# Patient Record
Sex: Male | Born: 1985 | Race: Asian | Hispanic: No | Marital: Single | Smoking: Current every day smoker
Health system: Southern US, Community
[De-identification: ages and names within clinical notes are randomized; demographics above are authoritative.]

## PROBLEM LIST (undated history)

## (undated) DIAGNOSIS — K4021 Bilateral inguinal hernia, without obstruction or gangrene, recurrent: Secondary | ICD-10-CM

## (undated) DIAGNOSIS — F32A Depression, unspecified: Secondary | ICD-10-CM

## (undated) DIAGNOSIS — F419 Anxiety disorder, unspecified: Secondary | ICD-10-CM

## (undated) DIAGNOSIS — I10 Essential (primary) hypertension: Secondary | ICD-10-CM

## (undated) DIAGNOSIS — F329 Major depressive disorder, single episode, unspecified: Secondary | ICD-10-CM

## (undated) HISTORY — PX: BRAIN SURGERY: SHX531

---

## 2011-03-01 ENCOUNTER — Emergency Department (HOSPITAL_COMMUNITY)
Admission: EM | Admit: 2011-03-01 | Discharge: 2011-03-01 | Disposition: A | Discharge status: Home / Self Care | Payer: 59 | Attending: Emergency Medicine | Admitting: Emergency Medicine

## 2011-03-01 DIAGNOSIS — F1010 Alcohol abuse, uncomplicated: Secondary | ICD-10-CM | POA: Insufficient documentation

## 2011-03-01 DIAGNOSIS — F1510 Other stimulant abuse, uncomplicated: Secondary | ICD-10-CM | POA: Insufficient documentation

## 2011-03-01 LAB — DIFFERENTIAL
Basophils Absolute: 0.1 10*3/uL (ref 0.0–0.1)
Lymphocytes Relative: 22 % (ref 12–46)
Monocytes Relative: 8 % (ref 3–12)

## 2011-03-01 LAB — CBC
MCV: 75 fL — ABNORMAL LOW (ref 78.0–100.0)
Platelets: 261 10*3/uL (ref 150–400)
RDW: 13.4 % (ref 11.5–15.5)
WBC: 10.2 10*3/uL (ref 4.0–10.5)

## 2011-03-01 LAB — COMPREHENSIVE METABOLIC PANEL
AST: 47 U/L — ABNORMAL HIGH (ref 0–37)
Albumin: 4.3 g/dL (ref 3.5–5.2)
Chloride: 98 mEq/L (ref 96–112)
Creatinine, Ser: 0.82 mg/dL (ref 0.50–1.35)
Potassium: 3.7 mEq/L (ref 3.5–5.1)
Total Bilirubin: 0.9 mg/dL (ref 0.3–1.2)
Total Protein: 8.7 g/dL — ABNORMAL HIGH (ref 6.0–8.3)

## 2011-03-01 LAB — RAPID URINE DRUG SCREEN, HOSP PERFORMED
Amphetamines: POSITIVE — AB
Benzodiazepines: NOT DETECTED
Cocaine: NOT DETECTED
Opiates: NOT DETECTED
Tetrahydrocannabinol: POSITIVE — AB

## 2011-04-12 ENCOUNTER — Inpatient Hospital Stay (INDEPENDENT_AMBULATORY_CARE_PROVIDER_SITE_OTHER)
Admission: RE | Admit: 2011-04-12 | Discharge: 2011-04-12 | Disposition: A | Discharge status: Home / Self Care | Payer: 59 | Source: Ambulatory Visit | Attending: Family Medicine | Admitting: Family Medicine

## 2011-04-12 DIAGNOSIS — R112 Nausea with vomiting, unspecified: Secondary | ICD-10-CM

## 2011-06-30 ENCOUNTER — Emergency Department (HOSPITAL_COMMUNITY): Payer: 59

## 2011-06-30 ENCOUNTER — Inpatient Hospital Stay (HOSPITAL_COMMUNITY)
Admission: EM | Admit: 2011-06-30 | Discharge: 2011-07-02 | DRG: 087 | Disposition: A | Discharge status: Home / Self Care | Payer: 59 | Source: Ambulatory Visit | Attending: Internal Medicine | Admitting: Internal Medicine

## 2011-06-30 ENCOUNTER — Emergency Department (HOSPITAL_COMMUNITY)
Admission: EM | Admit: 2011-06-30 | Discharge: 2011-06-30 | Disposition: A | Discharge status: Home / Self Care | Payer: 59 | Source: Home / Self Care | Attending: Emergency Medicine | Admitting: Emergency Medicine

## 2011-06-30 DIAGNOSIS — F17200 Nicotine dependence, unspecified, uncomplicated: Secondary | ICD-10-CM | POA: Diagnosis present

## 2011-06-30 DIAGNOSIS — F1510 Other stimulant abuse, uncomplicated: Secondary | ICD-10-CM | POA: Diagnosis present

## 2011-06-30 DIAGNOSIS — S066XAA Traumatic subarachnoid hemorrhage with loss of consciousness status unknown, initial encounter: Principal | ICD-10-CM | POA: Diagnosis present

## 2011-06-30 DIAGNOSIS — F1010 Alcohol abuse, uncomplicated: Secondary | ICD-10-CM | POA: Diagnosis present

## 2011-06-30 DIAGNOSIS — Z01812 Encounter for preprocedural laboratory examination: Secondary | ICD-10-CM

## 2011-06-30 DIAGNOSIS — S066X9A Traumatic subarachnoid hemorrhage with loss of consciousness of unspecified duration, initial encounter: Principal | ICD-10-CM | POA: Diagnosis present

## 2011-06-30 LAB — COMPREHENSIVE METABOLIC PANEL
Albumin: 4.3 g/dL (ref 3.5–5.2)
Alkaline Phosphatase: 53 U/L (ref 39–117)
BUN: 14 mg/dL (ref 6–23)
CO2: 27 mEq/L (ref 19–32)
Chloride: 100 mEq/L (ref 96–112)
GFR calc Af Amer: >90 mL/min (ref >90)
GFR calc non Af Amer: >90 mL/min (ref >90)
Glucose, Bld: 114 mg/dL — ABNORMAL HIGH (ref 70–99)
Potassium: 3.4 mEq/L — ABNORMAL LOW (ref 3.5–5.1)
Total Bilirubin: 0.9 mg/dL (ref 0.3–1.2)

## 2011-06-30 LAB — DIFFERENTIAL
Basophils Absolute: 0.1 10*3/uL (ref 0.0–0.1)
Eosinophils Absolute: 0.2 10*3/uL (ref 0.0–0.7)
Lymphocytes Relative: 15 % (ref 12–46)
Lymphs Abs: 1.6 10*3/uL (ref 0.7–4.0)
Monocytes Absolute: 0.5 10*3/uL (ref 0.1–1.0)
Neutro Abs: 8.2 10*3/uL — ABNORMAL HIGH (ref 1.7–7.7)

## 2011-06-30 LAB — RAPID URINE DRUG SCREEN, HOSP PERFORMED: Amphetamines: POSITIVE — AB

## 2011-06-30 LAB — CBC
MCH: 26 pg (ref 26.0–34.0)
MCHC: 35.1 g/dL (ref 30.0–36.0)
MCV: 74 fL — ABNORMAL LOW (ref 78.0–100.0)
RDW: 13.6 % (ref 11.5–15.5)

## 2011-06-30 LAB — PROTIME-INR: Prothrombin Time: 13.9 seconds (ref 11.6–15.2)

## 2011-06-30 LAB — GLUCOSE, CAPILLARY: Glucose-Capillary: 121 mg/dL — ABNORMAL HIGH (ref 70–99)

## 2011-06-30 LAB — ETHANOL: Alcohol, Ethyl (B): <11 mg/dL (ref 0–11)

## 2011-07-01 ENCOUNTER — Inpatient Hospital Stay (HOSPITAL_COMMUNITY): Payer: 59

## 2011-07-01 DIAGNOSIS — F1920 Other psychoactive substance dependence, uncomplicated: Secondary | ICD-10-CM

## 2011-07-01 LAB — COMPREHENSIVE METABOLIC PANEL
BUN: 13 mg/dL (ref 6–23)
Calcium: 9.4 mg/dL (ref 8.4–10.5)
GFR calc Af Amer: >90 mL/min (ref >90)
Glucose, Bld: 110 mg/dL — ABNORMAL HIGH (ref 70–99)
Sodium: 138 mEq/L (ref 135–145)
Total Protein: 7.2 g/dL (ref 6.0–8.3)

## 2011-07-01 LAB — CBC
Hemoglobin: 14 g/dL (ref 13.0–17.0)
MCH: 25.5 pg — ABNORMAL LOW (ref 26.0–34.0)
MCHC: 34.1 g/dL (ref 30.0–36.0)

## 2011-07-01 LAB — MRSA PCR SCREENING: MRSA by PCR: NEGATIVE

## 2011-07-02 ENCOUNTER — Inpatient Hospital Stay (HOSPITAL_COMMUNITY): Payer: 59

## 2011-07-02 LAB — BASIC METABOLIC PANEL
CO2: 26 mEq/L (ref 19–32)
Calcium: 9.2 mg/dL (ref 8.4–10.5)
GFR calc non Af Amer: >90 mL/min (ref >90)
Potassium: 3.4 mEq/L — ABNORMAL LOW (ref 3.5–5.1)
Sodium: 137 mEq/L (ref 135–145)

## 2011-07-02 LAB — CBC
MCH: 26 pg (ref 26.0–34.0)
MCHC: 34 g/dL (ref 30.0–36.0)
Platelets: 215 10*3/uL (ref 150–400)
RBC: 5.5 MIL/uL (ref 4.22–5.81)
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
## 2012-05-22 ENCOUNTER — Encounter (HOSPITAL_COMMUNITY): Payer: Self-pay | Admitting: *Deleted

## 2012-05-22 DIAGNOSIS — F17200 Nicotine dependence, unspecified, uncomplicated: Secondary | ICD-10-CM | POA: Insufficient documentation

## 2012-05-22 DIAGNOSIS — R110 Nausea: Secondary | ICD-10-CM | POA: Insufficient documentation

## 2012-05-22 LAB — URINE MICROSCOPIC-ADD ON

## 2012-05-22 LAB — COMPREHENSIVE METABOLIC PANEL
ALT: 23 U/L (ref 0–53)
AST: 27 U/L (ref 0–37)
Alkaline Phosphatase: 62 U/L (ref 39–117)
CO2: 28 mEq/L (ref 19–32)
Calcium: 10.3 mg/dL (ref 8.4–10.5)
Chloride: 100 mEq/L (ref 96–112)
GFR calc non Af Amer: 86 mL/min — ABNORMAL LOW (ref >90)
Potassium: 3.7 mEq/L (ref 3.5–5.1)
Sodium: 140 mEq/L (ref 135–145)

## 2012-05-22 LAB — URINALYSIS, ROUTINE W REFLEX MICROSCOPIC
Leukocytes, UA: NEGATIVE
Protein, ur: 30 mg/dL — AB
Specific Gravity, Urine: 1.035 — ABNORMAL HIGH (ref 1.005–1.030)
Urobilinogen, UA: 1 mg/dL (ref 0.0–1.0)

## 2012-05-22 LAB — CBC WITH DIFFERENTIAL/PLATELET
Basophils Absolute: 0.1 10*3/uL (ref 0.0–0.1)
Eosinophils Relative: 2 % (ref 0–5)
Lymphocytes Relative: 18 % (ref 12–46)
Neutro Abs: 7.2 10*3/uL (ref 1.7–7.7)
Neutrophils Relative %: 69 % (ref 43–77)
Platelets: 262 10*3/uL (ref 150–400)
RBC: 5.81 MIL/uL (ref 4.22–5.81)
RDW: 13.6 % (ref 11.5–15.5)
WBC: 10.4 10*3/uL (ref 4.0–10.5)

## 2012-05-22 NOTE — ED Notes (Signed)
Advised of the wait 

## 2012-05-22 NOTE — ED Notes (Signed)
The pt thinks he may have food poisioning he has abd pain nv and diarrhea with a sorethroat since last pm

## 2012-05-22 NOTE — ED Notes (Signed)
He reports he feels better but he needs a note for work

## 2012-05-23 ENCOUNTER — Emergency Department (HOSPITAL_COMMUNITY)
Admission: EM | Admit: 2012-05-23 | Discharge: 2012-05-23 | Disposition: A | Discharge status: Home / Self Care | Payer: Self-pay | Attending: Emergency Medicine | Admitting: Emergency Medicine

## 2012-05-23 DIAGNOSIS — R110 Nausea: Secondary | ICD-10-CM

## 2012-05-23 DIAGNOSIS — F1910 Other psychoactive substance abuse, uncomplicated: Secondary | ICD-10-CM

## 2012-05-23 NOTE — ED Notes (Signed)
Pt presents with c/o frequent relapsing  with meth last used on 05/14/12 and c/o  ? Food poisoning after eating unknown type of fish--unsure it was even fish since he stated "I was already messed up".  Does not know time of fish consumption. Has been nauseated, not vomiting "I just swallow it down". And feels like diarrheabfut "I just hold it in".  States generalized pain  Rated 7/10.  Has taken no pain meds.Marland Kitchen  LBM 3 days ago.

## 2012-05-23 NOTE — ED Provider Notes (Signed)
Medical screening examination/treatment/procedure(s) were performed by non-physician practitioner and as supervising physician I was immediately available for consultation/collaboration.  Jaidy Cottam, MD 05/23/12 0548 

## 2012-05-23 NOTE — ED Provider Notes (Signed)
History     CSN: [id number]  Arrival date & time 05/22/12  2027   First MD Initiated Contact with Patient 05/23/12 0208      Chief Complaint  Patient presents with  . poss food poisoning     (Consider location/radiation/quality/duration/timing/severity/associated sxs/prior treatment) HPI Comments: Patient states last night.  He drank a lot of alcohol use methamphetamines, which he uses intermittently.  Had several episodes of nausea, no vomiting, and one episode of loose stool.  He has had no repeat of these symptoms.  The last 12 hours.  He presents tonight stating he wants a pill to help him stop the craving for alcohol and, methadone.  He's been to [facility] at [facility] , for detox, approximately 3 months ago.  Is not suicidal is not homicidal.  Is requesting a note for work as he missed his shift last night  The history is provided by the patient.    History reviewed. No pertinent past medical history.  History reviewed. No pertinent past surgical history.  No family history on file.  History  Substance Use Topics  . Smoking status: Current Everyday Smoker  . Smokeless tobacco: Not on file  . Alcohol Use: Yes      Review of Systems  Constitutional: Negative for fever and chills.  Gastrointestinal: Positive for nausea. Negative for vomiting and abdominal pain.  Neurological: Negative for dizziness and headaches.    Allergies  Review of patient's allergies indicates no known allergies.  Home Medications  No current outpatient prescriptions on file.  BP 137/105  Pulse 105  Temp 98.7 F (37.1 C) (Oral)  Resp 18  SpO2 100%  Physical Exam  Constitutional: He appears well-developed and well-nourished.  HENT:  Head: Normocephalic.  Eyes: Pupils are equal, round, and reactive to light.  Neck: Normal range of motion.  Cardiovascular: Normal rate.   Abdominal: Soft. He exhibits no distension. There is no tenderness.  Musculoskeletal: Normal range of motion.    Neurological: He is alert.  Skin: Skin is warm.  Psychiatric: He has a normal mood and affect.    ED Course  Procedures (including critical care time)  Labs Reviewed  URINALYSIS, ROUTINE W REFLEX MICROSCOPIC - Abnormal; Notable for the following:    Color, Urine AMBER (*)  BIOCHEMICALS MAY BE AFFECTED BY COLOR   Specific Gravity, Urine 1.035 (*)     Bilirubin Urine SMALL (*)     Ketones, ur 40 (*)     Protein, ur 30 (*)     All other components within normal limits  CBC WITH DIFFERENTIAL - Abnormal; Notable for the following:    MCV 75.6 (*)     Monocytes Absolute 1.1 (*)     All other components within normal limits  COMPREHENSIVE METABOLIC PANEL - Abnormal; Notable for the following:    Total Protein 8.7 (*)     GFR calc non Af Amer 86 (*)     All other components within normal limits  RAPID STREP SCREEN  URINE MICROSCOPIC-ADD ON   No results found.   No diagnosis found.    MDM  Patient.  Is not suicidal, homicidal.  Is requesting resources in the community to help with his addiction problem, which include, alcohol, methamphetamines, and gambling he also is requesting a work note         Arman Filter, NP 05/23/12 0234

## 2013-01-11 ENCOUNTER — Emergency Department (HOSPITAL_COMMUNITY)
Admission: EM | Admit: 2013-01-11 | Discharge: 2013-01-11 | Disposition: A | Discharge status: Home / Self Care | Payer: Self-pay | Attending: Emergency Medicine | Admitting: Emergency Medicine

## 2013-01-11 ENCOUNTER — Emergency Department (HOSPITAL_COMMUNITY): Payer: Self-pay

## 2013-01-11 ENCOUNTER — Encounter (HOSPITAL_COMMUNITY): Payer: Self-pay | Admitting: Emergency Medicine

## 2013-01-11 DIAGNOSIS — K4020 Bilateral inguinal hernia, without obstruction or gangrene, not specified as recurrent: Secondary | ICD-10-CM | POA: Insufficient documentation

## 2013-01-11 DIAGNOSIS — N5089 Other specified disorders of the male genital organs: Secondary | ICD-10-CM | POA: Insufficient documentation

## 2013-01-11 DIAGNOSIS — F17200 Nicotine dependence, unspecified, uncomplicated: Secondary | ICD-10-CM | POA: Insufficient documentation

## 2013-01-11 DIAGNOSIS — N509 Disorder of male genital organs, unspecified: Secondary | ICD-10-CM | POA: Insufficient documentation

## 2013-01-11 LAB — CBC WITH DIFFERENTIAL/PLATELET
Basophils Absolute: 0.2 10*3/uL — ABNORMAL HIGH (ref 0.0–0.1)
Eosinophils Absolute: 0.5 10*3/uL (ref 0.0–0.7)
Hemoglobin: 14.4 g/dL (ref 13.0–17.0)
Lymphs Abs: 1.6 10*3/uL (ref 0.7–4.0)
MCH: 25.3 pg — ABNORMAL LOW (ref 26.0–34.0)
MCHC: 33.9 g/dL (ref 30.0–36.0)
Monocytes Absolute: 0.8 10*3/uL (ref 0.1–1.0)
Monocytes Relative: 5 % (ref 3–12)
Neutro Abs: 12.4 10*3/uL — ABNORMAL HIGH (ref 1.7–7.7)
Platelets: 236 10*3/uL (ref 150–400)
RBC: 5.69 MIL/uL (ref 4.22–5.81)
RDW: 14.1 % (ref 11.5–15.5)

## 2013-01-11 LAB — COMPREHENSIVE METABOLIC PANEL
AST: 25 U/L (ref 0–37)
Albumin: 3.8 g/dL (ref 3.5–5.2)
BUN: 16 mg/dL (ref 6–23)
Calcium: 9.2 mg/dL (ref 8.4–10.5)
Creatinine, Ser: 0.97 mg/dL (ref 0.50–1.35)
Total Bilirubin: 0.2 mg/dL — ABNORMAL LOW (ref 0.3–1.2)
Total Protein: 7.4 g/dL (ref 6.0–8.3)

## 2013-01-11 LAB — URINALYSIS, ROUTINE W REFLEX MICROSCOPIC
Bilirubin Urine: NEGATIVE
Leukocytes, UA: NEGATIVE
Nitrite: NEGATIVE
Specific Gravity, Urine: 1.013 (ref 1.005–1.030)
pH: 6 (ref 5.0–8.0)

## 2013-01-11 MED ORDER — HYDROCODONE-ACETAMINOPHEN 5-325 MG PO TABS: 2.0000 | ORAL_TABLET | ORAL | Status: DC | PRN

## 2013-01-11 MED ORDER — IOHEXOL 300 MG/ML  SOLN
100.0000 mL | Freq: Once | INTRAMUSCULAR | Status: AC | PRN
  Administered 2013-01-11: 100 mL via INTRAVENOUS

## 2013-01-11 MED ORDER — ONDANSETRON HCL 4 MG PO TABS: 4.0000 mg | ORAL_TABLET | Freq: Four times a day (QID) | ORAL | Status: DC

## 2013-01-11 MED ORDER — IOHEXOL 300 MG/ML  SOLN
50.0000 mL | Freq: Once | INTRAMUSCULAR | Status: AC | PRN
  Administered 2013-01-11: 50 mL via INTRAVENOUS

## 2013-01-11 MED ORDER — IBUPROFEN 800 MG PO TABS
800.0000 mg | ORAL_TABLET | Freq: Once | ORAL | Status: AC
  Administered 2013-01-11: 800 mg via ORAL
  Filled 2013-01-11: qty 1

## 2013-01-11 NOTE — ED Notes (Signed)
Patient transported to CT 

## 2013-01-11 NOTE — ED Provider Notes (Signed)
History     CSN: [id number]  Arrival date & time 01/11/13  2011   First MD Initiated Contact with Patient 01/11/13 2016      Chief Complaint  Patient presents with  . Abdominal Pain    (Consider location/radiation/quality/duration/timing/severity/associated sxs/prior treatment) HPI Comments: Patient complains of right groin pain and testicles swelling. It is difficult historian he states this is been going on for a number of months. That he may have had an injury when he is lifting at work. He complains of pain in his right inguinal area that radiates to his right testicle. He states his scrotum has been swollen for several months. He was told at a physical he had a "ruptured testicle sac". Denies any dysuria, hematuria, abdominal pain, nausea or vomiting. No change in bowel habits. No fever or chills.  The history is provided by the patient.    History reviewed. No pertinent past medical history.  History reviewed. No pertinent past surgical history.  No family history on file.  History  Substance Use Topics  . Smoking status: Current Every Day Smoker  . Smokeless tobacco: Not on file  . Alcohol Use: Yes      Review of Systems  Constitutional: Negative for fever, activity change and appetite change.  HENT: Negative for congestion, rhinorrhea and neck pain.   Eyes: Negative for visual disturbance.  Respiratory: Negative for cough, chest tightness and shortness of breath.   Cardiovascular: Negative for chest pain.  Gastrointestinal: Positive for abdominal pain. Negative for nausea, vomiting and diarrhea.  Genitourinary: Positive for scrotal swelling and testicular pain. Negative for dysuria and hematuria.  Musculoskeletal: Negative for back pain and arthralgias.  Neurological: Negative for dizziness, weakness and headaches.  A complete 10 system review of systems was obtained and all systems are negative except as noted in the HPI and PMH.    Allergies  Review of  patient's allergies indicates no known allergies.  Home Medications   Current Outpatient Rx  Name  Route  Sig  Dispense  Refill  . acetaminophen (TYLENOL) 325 MG tablet   Oral   Take 650 mg by mouth every 6 (six) hours as needed for pain.         Marland Kitchen HYDROcodone-acetaminophen (NORCO/VICODIN) 5-325 MG per tablet   Oral   Take 2 tablets by mouth every 4 (four) hours as needed for pain.   10 tablet   0   . ondansetron (ZOFRAN) 4 MG tablet   Oral   Take 1 tablet (4 mg total) by mouth every 6 (six) hours.   12 tablet   0     BP 130/79  Pulse 83  Temp(Src) 97.7 F (36.5 C) (Oral)  Resp 18  SpO2 98%  Physical Exam  Constitutional: He is oriented to person, place, and time. He appears well-developed and well-nourished. No distress.  HENT:  Head: Normocephalic and atraumatic.  Mouth/Throat: Oropharynx is clear and moist. No oropharyngeal exudate.  Eyes: Conjunctivae and EOM are normal. Pupils are equal, round, and reactive to light.  Neck: Normal range of motion. Neck supple.  Cardiovascular: Normal rate, regular rhythm and normal heart sounds.   No murmur heard. Pulmonary/Chest: Effort normal and breath sounds normal. No respiratory distress.  Abdominal: Soft. There is tenderness. There is no rebound and no guarding.  TTP R inguinal area without obvious hernia.  Genitourinary:  Diffuse scrotal swelling.  Testicles nontender to palpation.  Musculoskeletal: Normal range of motion. He exhibits no edema and no tenderness.  Neurological:  He is alert and oriented to person, place, and time. No cranial nerve deficit. He exhibits normal muscle tone. Coordination normal.  Skin: Skin is warm.    ED Course  Procedures (including critical care time)  Labs Reviewed  CBC WITH DIFFERENTIAL - Abnormal; Notable for the following:    WBC 15.5 (*)    MCV 74.7 (*)    MCH 25.3 (*)    Neutrophils Relative 81 (*)    Lymphocytes Relative 10 (*)    Neutro Abs 12.4 (*)    Basophils  Absolute 0.2 (*)    All other components within normal limits  COMPREHENSIVE METABOLIC PANEL - Abnormal; Notable for the following:    Glucose, Bld 102 (*)    Total Bilirubin 0.2 (*)    All other components within normal limits  URINALYSIS, ROUTINE W REFLEX MICROSCOPIC   US Scrotum  01/11/2013  *RADIOLOGY REPORT*  Clinical Data:  Right testicular pain  SCROTAL ULTRASOUND DOPPLER ULTRASOUND OF THE TESTICLES  Technique: Complete ultrasound examination of the testicles, epididymis, and other scrotal structures was performed.  Color and spectral Doppler ultrasound were also utilized to evaluate blood flow to the testicles.  Comparison:  None.  Findings:  Right testis:  Normal in size and appearance, measuring 4.0 x 2.0 x 2.3 cm.  Left testis:  Normal in size appearance, measuring 4.3 x 2.1 x 3.0 cm.  Right epididymis:  Normal in size and appearance.  Left epididymis:  Normal in size and appearance.  Hydrocele:  Absent.  Varicocele:  Small left varicocele.  Pulsed Doppler interrogation of both testes demonstrates low resistance flow bilaterally.  During Valsalva, no bowel-containing hernia is seen in the right inguinal canal.  IMPRESSION: Normal sonographic appearance of the bilateral testes.  No evidence of testicular torsion.  Small left varicocele.   Original Report Authenticated By: Charline Bills, M.D.    Ct Abdomen Pelvis W Contrast  01/11/2013  *RADIOLOGY REPORT*  Clinical Data: Right-sided abdominal pain described as a burning and standing.  The patient reports an area of swelling in this location.  Some nausea without vomiting.  CT ABDOMEN AND PELVIS WITH CONTRAST  Technique:  Multidetector CT imaging of the abdomen and pelvis was performed following the standard protocol during bolus administration of intravenous contrast.  Contrast: OMNIPAQUE IOHEXOL 300 MG/ML  SOLN, 50mL OMNIPAQUE IOHEXOL 300 MG/ML  SOLN  Comparison: None.  Findings: There are bilateral fat containing inguinal hernias.  A  portion of the right transverse colon, which descends into the pelvis, lies adjacent to the right internal inguinal canal but does not enter the hernia.  Clear lung bases.  The heart is normal in size.  Normal liver, spleen, gallbladder and pancreas.  No bile duct dilation.  No adrenal masses.  There is mild dilation of the right renal pelvis and portions of the right ureter.  No right ureteral stone is seen.  This may be physiologic.  There is no CT evidence of pyelonephritis.  There are no renal masses.  The left renal collecting system and ureter are unremarkable.  The bladder is mostly decompressed.  No bladder mass or stone is seen.  No adenopathy.  No abnormal fluid collections.  Bowel is unremarkable.  Normal appendix.  No bony abnormality.  IMPRESSION: Mild dilation of the right renal pelvis of unclear etiology.  It is most likely physiologic.  No renal or ureteral stone is seen. However, depending on the symptoms, a recently passed ureteral stone should be considered.  Bilateral fat containing  inguinal hernias.  No bowel enters either of these.  No other abnormalities.  A normal appendix is visualized.   Original Report Authenticated By: Amie Portland, M.D.    [place] Art/ven Flow Abd Pelv Doppler  01/11/2013  *RADIOLOGY REPORT*  Clinical Data:  Right testicular pain  SCROTAL ULTRASOUND DOPPLER ULTRASOUND OF THE TESTICLES  Technique: Complete ultrasound examination of the testicles, epididymis, and other scrotal structures was performed.  Color and spectral Doppler ultrasound were also utilized to evaluate blood flow to the testicles.  Comparison:  None.  Findings:  Right testis:  Normal in size and appearance, measuring 4.0 x 2.0 x 2.3 cm.  Left testis:  Normal in size appearance, measuring 4.3 x 2.1 x 3.0 cm.  Right epididymis:  Normal in size and appearance.  Left epididymis:  Normal in size and appearance.  Hydrocele:  Absent.  Varicocele:  Small left varicocele.  Pulsed Doppler interrogation of both testes  demonstrates low resistance flow bilaterally.  During Valsalva, no bowel-containing hernia is seen in the right inguinal canal.  IMPRESSION: Normal sonographic appearance of the bilateral testes.  No evidence of testicular torsion.  Small left varicocele.   Original Report Authenticated By: Charline Bills, M.D.      1. Inguinal hernia, bilateral       MDM  Right-sided groin pain that has been going on for several weeks but worse tonight. Associated right testicle pain. No fevers, abdominal pain, nausea or vomiting.  Abdomen is soft.  Urinalysis is negative. Testicles appear normal on ultrasound. No evidence of torsion.  CT shows fat containing bilateral inguinal hernias.  No bowel contents.  Patient referred to surgery for elective repair.  Pain control given.          Glynn Octave, MD 01/11/13 [phone]

## 2013-01-11 NOTE — ED Notes (Signed)
Patient with lower right sided abdominal pain that he describes as burning and stabbing.  Patient reports there is an area of swelling there as well.  Patient reports he has had some nausea but no vomiting.  The pain makes the patient shiver he reports.  No injury to the area reported.  Patient reports a history of a "ruptured testicle sac."  He was told this during an employment physical.

## 2015-07-12 ENCOUNTER — Emergency Department (HOSPITAL_COMMUNITY): Payer: Self-pay

## 2015-07-12 ENCOUNTER — Emergency Department (HOSPITAL_COMMUNITY): Payer: Self-pay | Admitting: Anesthesiology

## 2015-07-12 ENCOUNTER — Inpatient Hospital Stay (HOSPITAL_COMMUNITY)
Admission: EM | Admit: 2015-07-12 | Discharge: 2015-07-14 | DRG: 352 | Disposition: A | Discharge status: Home / Self Care | Payer: Self-pay | Attending: General Surgery | Admitting: General Surgery

## 2015-07-12 ENCOUNTER — Encounter (HOSPITAL_COMMUNITY): Admission: EM | Disposition: A | Discharge status: Home / Self Care | Payer: Self-pay | Source: Home / Self Care

## 2015-07-12 ENCOUNTER — Encounter (HOSPITAL_COMMUNITY): Payer: Self-pay | Admitting: Oncology

## 2015-07-12 DIAGNOSIS — F329 Major depressive disorder, single episode, unspecified: Secondary | ICD-10-CM | POA: Diagnosis present

## 2015-07-12 DIAGNOSIS — E876 Hypokalemia: Secondary | ICD-10-CM | POA: Diagnosis present

## 2015-07-12 DIAGNOSIS — I10 Essential (primary) hypertension: Secondary | ICD-10-CM | POA: Diagnosis present

## 2015-07-12 DIAGNOSIS — F1290 Cannabis use, unspecified, uncomplicated: Secondary | ICD-10-CM | POA: Diagnosis present

## 2015-07-12 DIAGNOSIS — Z791 Long term (current) use of non-steroidal anti-inflammatories (NSAID): Secondary | ICD-10-CM

## 2015-07-12 DIAGNOSIS — F419 Anxiety disorder, unspecified: Secondary | ICD-10-CM | POA: Diagnosis present

## 2015-07-12 DIAGNOSIS — N50819 Testicular pain, unspecified: Secondary | ICD-10-CM | POA: Diagnosis present

## 2015-07-12 DIAGNOSIS — N5089 Other specified disorders of the male genital organs: Secondary | ICD-10-CM | POA: Diagnosis present

## 2015-07-12 DIAGNOSIS — K4030 Unilateral inguinal hernia, with obstruction, without gangrene, not specified as recurrent: Principal | ICD-10-CM | POA: Diagnosis present

## 2015-07-12 DIAGNOSIS — F17200 Nicotine dependence, unspecified, uncomplicated: Secondary | ICD-10-CM | POA: Diagnosis present

## 2015-07-12 HISTORY — DX: Bilateral inguinal hernia, without obstruction or gangrene, recurrent: K40.21

## 2015-07-12 HISTORY — DX: Anxiety disorder, unspecified: F41.9

## 2015-07-12 HISTORY — DX: Essential (primary) hypertension: I10

## 2015-07-12 HISTORY — PX: LAPAROTOMY: SHX154

## 2015-07-12 HISTORY — DX: Major depressive disorder, single episode, unspecified: F32.9

## 2015-07-12 HISTORY — DX: Depression, unspecified: F32.A

## 2015-07-12 HISTORY — PX: INGUINAL HERNIA REPAIR: SHX194

## 2015-07-12 LAB — CBC WITH DIFFERENTIAL/PLATELET
BASOS ABS: 0.1 10*3/uL (ref 0.0–0.1)
BASOS PCT: 1 %
EOS ABS: 0.2 10*3/uL (ref 0.0–0.7)
EOS PCT: 1 %
HCT: 44.6 % (ref 39.0–52.0)
HEMOGLOBIN: 15.6 g/dL (ref 13.0–17.0)
LYMPHS ABS: 1.9 10*3/uL (ref 0.7–4.0)
Lymphocytes Relative: 13 %
MCH: 26.4 pg (ref 26.0–34.0)
MCHC: 35 g/dL (ref 30.0–36.0)
MCV: 75.6 fL — ABNORMAL LOW (ref 78.0–100.0)
Monocytes Absolute: 1 10*3/uL (ref 0.1–1.0)
Monocytes Relative: 7 %
NEUTROS PCT: 78 %
Neutro Abs: 11.4 10*3/uL — ABNORMAL HIGH (ref 1.7–7.7)
PLATELETS: 266 10*3/uL (ref 150–400)
RBC: 5.9 MIL/uL — AB (ref 4.22–5.81)
RDW: 13.3 % (ref 11.5–15.5)
WBC: 14.6 10*3/uL — ABNORMAL HIGH (ref 4.0–10.5)

## 2015-07-12 LAB — BASIC METABOLIC PANEL
ANION GAP: 10 (ref 5–15)
BUN: 15 mg/dL (ref 6–20)
CHLORIDE: 100 mmol/L — AB (ref 101–111)
CO2: 25 mmol/L (ref 22–32)
Calcium: 9.5 mg/dL (ref 8.9–10.3)
Creatinine, Ser: 0.95 mg/dL (ref 0.61–1.24)
Glucose, Bld: 91 mg/dL (ref 65–99)
POTASSIUM: 3.3 mmol/L — AB (ref 3.5–5.1)
SODIUM: 135 mmol/L (ref 135–145)

## 2015-07-12 SURGERY — LAPAROTOMY, EXPLORATORY
Anesthesia: General | Site: Abdomen | Laterality: Right

## 2015-07-12 SURGERY — LAPAROTOMY, EXPLORATORY
Anesthesia: General | Laterality: Right

## 2015-07-12 MED ORDER — HYDROMORPHONE HCL 1 MG/ML IJ SOLN: 0.2500 mg | INTRAMUSCULAR | Status: DC | PRN

## 2015-07-12 MED ORDER — CEFAZOLIN SODIUM-DEXTROSE 2-3 GM-% IV SOLR: 2.0000 g | Freq: Three times a day (TID) | INTRAVENOUS | Status: DC

## 2015-07-12 MED ORDER — HYDROMORPHONE HCL 1 MG/ML IJ SOLN
1.0000 mg | INTRAMUSCULAR | Status: DC | PRN
  Administered 2015-07-12 (×2): 1 mg via INTRAVENOUS
  Filled 2015-07-12 (×2): qty 1

## 2015-07-12 MED ORDER — DEXTROSE IN LACTATED RINGERS 5 % IV SOLN
INTRAVENOUS | Status: DC
Start: 2015-07-12 — End: 2015-07-12

## 2015-07-12 MED ORDER — ENOXAPARIN SODIUM 40 MG/0.4ML ~~LOC~~ SOLN
40.0000 mg | SUBCUTANEOUS | Status: DC
  Administered 2015-07-13: 40 mg via SUBCUTANEOUS
  Filled 2015-07-12 (×3): qty 0.4

## 2015-07-12 MED ORDER — SUCCINYLCHOLINE CHLORIDE 20 MG/ML IJ SOLN
INTRAMUSCULAR | Status: DC | PRN
  Administered 2015-07-12: 100 mg via INTRAVENOUS
  Administered 2015-07-12: 60 mg via INTRAVENOUS

## 2015-07-12 MED ORDER — BUPIVACAINE-EPINEPHRINE (PF) 0.25% -1:200000 IJ SOLN
INTRAMUSCULAR | Status: AC
  Filled 2015-07-12: qty 30

## 2015-07-12 MED ORDER — METHOCARBAMOL 500 MG PO TABS: 500.0000 mg | ORAL_TABLET | Freq: Four times a day (QID) | ORAL | Status: DC | PRN

## 2015-07-12 MED ORDER — ONDANSETRON HCL 4 MG/2ML IJ SOLN
INTRAMUSCULAR | Status: DC | PRN
  Administered 2015-07-12: 4 mg via INTRAVENOUS

## 2015-07-12 MED ORDER — CEFAZOLIN SODIUM-DEXTROSE 2-3 GM-% IV SOLR
2.0000 g | INTRAVENOUS | Status: AC
  Administered 2015-07-12: 2 g via INTRAVENOUS

## 2015-07-12 MED ORDER — FENTANYL CITRATE (PF) 100 MCG/2ML IJ SOLN
INTRAMUSCULAR | Status: DC | PRN
  Administered 2015-07-12: 100 ug via INTRAVENOUS
  Administered 2015-07-12: 50 ug via INTRAVENOUS
  Administered 2015-07-12: 100 ug via INTRAVENOUS

## 2015-07-12 MED ORDER — NEOSTIGMINE METHYLSULFATE 10 MG/10ML IV SOLN
INTRAVENOUS | Status: DC | PRN
Start: 2015-07-12 — End: 2015-07-12
  Administered 2015-07-12: 4 mg via INTRAVENOUS

## 2015-07-12 MED ORDER — INFLUENZA VAC SPLIT QUAD 0.5 ML IM SUSY
0.5000 mL | PREFILLED_SYRINGE | INTRAMUSCULAR | Status: DC
  Filled 2015-07-12 (×2): qty 0.5

## 2015-07-12 MED ORDER — MIDAZOLAM HCL 5 MG/5ML IJ SOLN
INTRAMUSCULAR | Status: DC | PRN
  Administered 2015-07-12: 2 mg via INTRAVENOUS

## 2015-07-12 MED ORDER — HYDROMORPHONE HCL 1 MG/ML IJ SOLN
1.0000 mg | Freq: Once | INTRAMUSCULAR | Status: AC
  Administered 2015-07-12: 1 mg via INTRAVENOUS
  Filled 2015-07-12: qty 1

## 2015-07-12 MED ORDER — DEXAMETHASONE SODIUM PHOSPHATE 10 MG/ML IJ SOLN
INTRAMUSCULAR | Status: AC
  Filled 2015-07-12: qty 1

## 2015-07-12 MED ORDER — OXYCODONE HCL 5 MG PO TABS
5.0000 mg | ORAL_TABLET | ORAL | Status: DC | PRN
  Administered 2015-07-12: 10 mg via ORAL
  Administered 2015-07-12: 5 mg via ORAL
  Administered 2015-07-12 – 2015-07-14 (×9): 10 mg via ORAL
  Filled 2015-07-12: qty 2
  Filled 2015-07-12: qty 1
  Filled 2015-07-12 (×9): qty 2

## 2015-07-12 MED ORDER — CEFAZOLIN SODIUM-DEXTROSE 2-3 GM-% IV SOLR
INTRAVENOUS | Status: AC
  Filled 2015-07-12: qty 50

## 2015-07-12 MED ORDER — PROPOFOL 10 MG/ML IV BOLUS
INTRAVENOUS | Status: DC | PRN
  Administered 2015-07-12: 200 mg via INTRAVENOUS

## 2015-07-12 MED ORDER — ACETAMINOPHEN 325 MG PO TABS: 650.0000 mg | ORAL_TABLET | Freq: Four times a day (QID) | ORAL | Status: DC | PRN

## 2015-07-12 MED ORDER — SIMETHICONE 80 MG PO CHEW
40.0000 mg | CHEWABLE_TABLET | Freq: Four times a day (QID) | ORAL | Status: DC | PRN
  Filled 2015-07-12: qty 1

## 2015-07-12 MED ORDER — LIDOCAINE HCL (CARDIAC) 20 MG/ML IV SOLN
INTRAVENOUS | Status: AC
  Filled 2015-07-12: qty 5

## 2015-07-12 MED ORDER — ACETAMINOPHEN 650 MG RE SUPP: 650.0000 mg | Freq: Four times a day (QID) | RECTAL | Status: DC | PRN

## 2015-07-12 MED ORDER — ROCURONIUM BROMIDE 100 MG/10ML IV SOLN
INTRAVENOUS | Status: AC
  Filled 2015-07-12: qty 1

## 2015-07-12 MED ORDER — PROPOFOL 10 MG/ML IV BOLUS
INTRAVENOUS | Status: AC
  Filled 2015-07-12: qty 20

## 2015-07-12 MED ORDER — LACTATED RINGERS IV BOLUS (SEPSIS)
1000.0000 mL | Freq: Once | INTRAVENOUS | Status: DC
Start: 2015-07-12 — End: 2015-07-13

## 2015-07-12 MED ORDER — MIDAZOLAM HCL 2 MG/2ML IJ SOLN
INTRAMUSCULAR | Status: AC
  Filled 2015-07-12: qty 4

## 2015-07-12 MED ORDER — CEFAZOLIN SODIUM-DEXTROSE 2-3 GM-% IV SOLR
2.0000 g | Freq: Three times a day (TID) | INTRAVENOUS | Status: AC
  Administered 2015-07-12 – 2015-07-13 (×3): 2 g via INTRAVENOUS
  Filled 2015-07-12 (×3): qty 50

## 2015-07-12 MED ORDER — GLYCOPYRROLATE 0.2 MG/ML IJ SOLN
INTRAMUSCULAR | Status: DC | PRN
  Administered 2015-07-12: 0.6 mg via INTRAVENOUS

## 2015-07-12 MED ORDER — BUPIVACAINE-EPINEPHRINE 0.25% -1:200000 IJ SOLN
INTRAMUSCULAR | Status: DC | PRN
  Administered 2015-07-12: 10 mL

## 2015-07-12 MED ORDER — KCL IN DEXTROSE-NACL 20-5-0.45 MEQ/L-%-% IV SOLN
INTRAVENOUS | Status: DC
  Administered 2015-07-12: 12:00:00 via INTRAVENOUS
  Filled 2015-07-12 (×5): qty 1000

## 2015-07-12 MED ORDER — IOHEXOL 300 MG/ML  SOLN
100.0000 mL | Freq: Once | INTRAMUSCULAR | Status: AC | PRN
  Administered 2015-07-12: 100 mL via INTRAVENOUS

## 2015-07-12 MED ORDER — DOCUSATE SODIUM 100 MG PO CAPS
100.0000 mg | ORAL_CAPSULE | Freq: Two times a day (BID) | ORAL | Status: DC
  Administered 2015-07-12 – 2015-07-13 (×3): 100 mg via ORAL
  Filled 2015-07-12 (×6): qty 1

## 2015-07-12 MED ORDER — ONDANSETRON HCL 4 MG/2ML IJ SOLN
INTRAMUSCULAR | Status: AC
  Filled 2015-07-12: qty 2

## 2015-07-12 MED ORDER — ONDANSETRON HCL 4 MG/2ML IJ SOLN
4.0000 mg | Freq: Once | INTRAMUSCULAR | Status: AC
  Administered 2015-07-12: 4 mg via INTRAVENOUS
  Filled 2015-07-12: qty 2

## 2015-07-12 MED ORDER — GLYCOPYRROLATE 0.2 MG/ML IJ SOLN
INTRAMUSCULAR | Status: AC
  Filled 2015-07-12: qty 3

## 2015-07-12 MED ORDER — ROCURONIUM BROMIDE 100 MG/10ML IV SOLN
INTRAVENOUS | Status: DC | PRN
  Administered 2015-07-12: 10 mg via INTRAVENOUS
  Administered 2015-07-12: 25 mg via INTRAVENOUS

## 2015-07-12 MED ORDER — LACTATED RINGERS IV SOLN
INTRAVENOUS | Status: DC | PRN
  Administered 2015-07-12 (×2): via INTRAVENOUS

## 2015-07-12 MED ORDER — 0.9 % SODIUM CHLORIDE (POUR BTL) OPTIME
TOPICAL | Status: DC | PRN
  Administered 2015-07-12: 1000 mL

## 2015-07-12 MED ORDER — LIDOCAINE HCL (CARDIAC) 20 MG/ML IV SOLN
INTRAVENOUS | Status: DC | PRN
  Administered 2015-07-12: 75 mg via INTRAVENOUS

## 2015-07-12 MED ORDER — POTASSIUM CHLORIDE 10 MEQ/100ML IV SOLN: 10.0000 meq | INTRAVENOUS | Status: AC

## 2015-07-12 MED ORDER — ONDANSETRON 4 MG PO TBDP: 4.0000 mg | ORAL_TABLET | Freq: Four times a day (QID) | ORAL | Status: DC | PRN

## 2015-07-12 MED ORDER — IOHEXOL 300 MG/ML  SOLN
50.0000 mL | Freq: Once | INTRAMUSCULAR | Status: AC | PRN
  Administered 2015-07-12: 50 mL via ORAL

## 2015-07-12 MED ORDER — FENTANYL CITRATE (PF) 250 MCG/5ML IJ SOLN
INTRAMUSCULAR | Status: AC
  Filled 2015-07-12: qty 25

## 2015-07-12 MED ORDER — BISACODYL 5 MG PO TBEC: 5.0000 mg | DELAYED_RELEASE_TABLET | Freq: Every day | ORAL | Status: DC | PRN

## 2015-07-12 MED ORDER — ACETAMINOPHEN 10 MG/ML IV SOLN
INTRAVENOUS | Status: AC
  Filled 2015-07-12: qty 100

## 2015-07-12 MED ORDER — NEOSTIGMINE METHYLSULFATE 10 MG/10ML IV SOLN
INTRAVENOUS | Status: AC
  Filled 2015-07-12: qty 1

## 2015-07-12 MED ORDER — PROMETHAZINE HCL 25 MG/ML IJ SOLN: 6.2500 mg | INTRAMUSCULAR | Status: DC | PRN

## 2015-07-12 MED ORDER — DEXAMETHASONE SODIUM PHOSPHATE 10 MG/ML IJ SOLN
INTRAMUSCULAR | Status: DC | PRN
  Administered 2015-07-12: 10 mg via INTRAVENOUS

## 2015-07-12 MED ORDER — ONDANSETRON HCL 4 MG/2ML IJ SOLN: 4.0000 mg | Freq: Four times a day (QID) | INTRAMUSCULAR | Status: DC | PRN

## 2015-07-12 SURGICAL SUPPLY — 62 items
APPLICATOR COTTON TIP 6IN STRL (MISCELLANEOUS) ×6 IMPLANT
BENZOIN TINCTURE PRP APPL 2/3 (GAUZE/BANDAGES/DRESSINGS) IMPLANT
BLADE EXTENDED COATED 6.5IN (ELECTRODE) IMPLANT
BLADE HEX COATED 2.75 (ELECTRODE) ×3 IMPLANT
CHLORAPREP W/TINT 26ML (MISCELLANEOUS) ×3 IMPLANT
CLOSURE WOUND 1/2 X4 (GAUZE/BANDAGES/DRESSINGS)
COVER MAYO STAND STRL (DRAPES) IMPLANT
COVER SURGICAL LIGHT HANDLE (MISCELLANEOUS) ×3 IMPLANT
DECANTER SPIKE VIAL GLASS SM (MISCELLANEOUS) ×3 IMPLANT
DERMABOND ADVANCED (GAUZE/BANDAGES/DRESSINGS) ×2
DERMABOND ADVANCED .7 DNX12 (GAUZE/BANDAGES/DRESSINGS) ×1 IMPLANT
DRAIN PENROSE 18X1/2 LTX STRL (DRAIN) IMPLANT
DRAPE LAPAROSCOPIC ABDOMINAL (DRAPES) ×3 IMPLANT
DRAPE WARM FLUID 44X44 (DRAPE) IMPLANT
ELECT REM PT RETURN 9FT ADLT (ELECTROSURGICAL) ×3
ELECTRODE REM PT RTRN 9FT ADLT (ELECTROSURGICAL) ×1 IMPLANT
GAUZE SPONGE 4X4 12PLY STRL (GAUZE/BANDAGES/DRESSINGS) ×3 IMPLANT
GAUZE SPONGE 4X4 16PLY XRAY LF (GAUZE/BANDAGES/DRESSINGS) ×3 IMPLANT
GLOVE BIO SURGEON STRL SZ7 (GLOVE) ×3 IMPLANT
GLOVE BIOGEL PI IND STRL 7.0 (GLOVE) IMPLANT
GLOVE BIOGEL PI IND STRL 7.5 (GLOVE) ×1 IMPLANT
GLOVE BIOGEL PI INDICATOR 7.0 (GLOVE)
GLOVE BIOGEL PI INDICATOR 7.5 (GLOVE) ×2
GOWN STRL REUS W/ TWL XL LVL3 (GOWN DISPOSABLE) ×1 IMPLANT
GOWN STRL REUS W/TWL LRG LVL3 (GOWN DISPOSABLE) ×6 IMPLANT
GOWN STRL REUS W/TWL XL LVL3 (GOWN DISPOSABLE) ×5 IMPLANT
KIT BASIN OR (CUSTOM PROCEDURE TRAY) ×3 IMPLANT
LIGASURE IMPACT 36 18CM CVD LR (INSTRUMENTS) IMPLANT
LIQUID BAND (GAUZE/BANDAGES/DRESSINGS) IMPLANT
MESH ULTRAPRO 3X6 7.6X15CM (Mesh General) ×3 IMPLANT
NEEDLE HYPO 25X1 1.5 SAFETY (NEEDLE) ×3 IMPLANT
NS IRRIG 1000ML POUR BTL (IV SOLUTION) ×6 IMPLANT
PACK GENERAL/GYN (CUSTOM PROCEDURE TRAY) ×3 IMPLANT
SHEARS FOC LG CVD HARMONIC 17C (MISCELLANEOUS) IMPLANT
SHEARS HARMONIC ACE PLUS 36CM (ENDOMECHANICALS) IMPLANT
SPONGE LAP 18X18 X RAY DECT (DISPOSABLE) IMPLANT
STAPLER VISISTAT 35W (STAPLE) ×3 IMPLANT
STRIP CLOSURE SKIN 1/2X4 (GAUZE/BANDAGES/DRESSINGS) IMPLANT
SUCTION POOLE TIP (SUCTIONS) IMPLANT
SUT MNCRL AB 4-0 PS2 18 (SUTURE) ×3 IMPLANT
SUT PDS AB 1 CTX 36 (SUTURE) IMPLANT
SUT PDS AB 1 TP1 96 (SUTURE) IMPLANT
SUT PROLENE 2 0 SH DA (SUTURE) ×6 IMPLANT
SUT SILK 2 0 (SUTURE) ×2
SUT SILK 2 0 SH (SUTURE) IMPLANT
SUT SILK 2 0 SH CR/8 (SUTURE) IMPLANT
SUT SILK 2-0 18XBRD TIE 12 (SUTURE) ×1 IMPLANT
SUT SILK 3 0 (SUTURE)
SUT SILK 3 0 SH CR/8 (SUTURE) IMPLANT
SUT SILK 3-0 18XBRD TIE 12 (SUTURE) IMPLANT
SUT VIC AB 2-0 SH 18 (SUTURE) ×3 IMPLANT
SUT VIC AB 2-0 SH 27 (SUTURE) ×4
SUT VIC AB 2-0 SH 27X BRD (SUTURE) ×2 IMPLANT
SUT VIC AB 3-0 54XBRD REEL (SUTURE) ×1 IMPLANT
SUT VIC AB 3-0 BRD 54 (SUTURE) ×2
SUT VIC AB 3-0 SH 27 (SUTURE) ×2
SUT VIC AB 3-0 SH 27XBRD (SUTURE) ×1 IMPLANT
SYR CONTROL 10ML LL (SYRINGE) ×3 IMPLANT
TOWEL OR 17X26 10 PK STRL BLUE (TOWEL DISPOSABLE) ×3 IMPLANT
TRAY FOLEY W/METER SILVER 14FR (SET/KITS/TRAYS/PACK) IMPLANT
TRAY FOLEY W/METER SILVER 16FR (SET/KITS/TRAYS/PACK) IMPLANT
YANKAUER SUCT BULB TIP NO VENT (SUCTIONS) IMPLANT

## 2015-07-12 NOTE — ED Provider Notes (Signed)
CSN: [id number]     Arrival date & time 07/12/15  0226 History   First MD Initiated Contact with Patient 07/12/15 0231     Chief Complaint  Patient presents with  . Inguinal Hernia     (Consider location/radiation/quality/duration/timing/severity/associated sxs/prior Treatment) HPI Comments: Patient presents with severe right inguinal and testicular pain with swelling that started earlier this evening. He has a known hernia diagnosed 2 years ago. He reports intermittent pain since diagnosis that "I usually walk off". Tonight the pain started about 8 hours ago and has caused nausea and vomiting. No fever. He reports his usual inguinal swelling is worse.   The history is provided by the patient. No language interpreter was used.    Past Medical History  Diagnosis Date  . Inguinal hernia recurrent bilateral   . Hypertension   . Anxiety   . Depression    Past Surgical History  Procedure Laterality Date  . Brain surgery  2013?    brain bleed   No family history on file. Social History  Substance Use Topics  . Smoking status: Current Every Day Smoker  . Smokeless tobacco: Never Used  . Alcohol Use: Yes    Review of Systems  Constitutional: Negative for fever and chills.  Gastrointestinal: Positive for vomiting and abdominal pain.  Genitourinary: Positive for scrotal swelling.       See HPI.  Neurological: Negative.       Allergies  Review of patient's allergies indicates no known allergies.  Home Medications   Prior to Admission medications   Medication Sig Start Date End Date Taking? Authorizing Provider  ibuprofen (ADVIL,MOTRIN) 200 MG tablet Take 400 mg by mouth every 6 (six) hours as needed (for pain.).   Yes Historical Provider, MD   BP 123/95 mmHg  Pulse 88  Temp(Src) 98.2 F (36.8 C) (Oral)  Resp 20  Ht  (1.753 m)  Wt 180 lb (81.647 kg)  BMI 26.57 kg/m2  SpO2 99% Physical Exam  Constitutional: He is oriented to person, place, and time. He appears  well-developed and well-nourished.  Neck: Normal range of motion.  Pulmonary/Chest: Effort normal.  Genitourinary:  Marked right testicular swelling with tenderness. There is a large swollen mass at the right base of the penis that is immobile, significantly hard and exquisitely tender.   Musculoskeletal: Normal range of motion.  Neurological: He is alert and oriented to person, place, and time.  Skin: Skin is warm and dry.  Psychiatric: He has a normal mood and affect.    ED Course  Procedures (including critical care time) Labs Review Labs Reviewed  CBC WITH DIFFERENTIAL/PLATELET - Abnormal; Notable for the following:    WBC 14.6 (*)    RBC 5.90 (*)    MCV 75.6 (*)    Neutro Abs 11.4 (*)    All other components within normal limits  BASIC METABOLIC PANEL   Results for orders placed or performed during the hospital encounter of 07/12/15  CBC with Differential  Result Value Ref Range   WBC 14.6 (H) 4.0 - 10.5 K/uL   RBC 5.90 (H) 4.22 - 5.81 MIL/uL   Hemoglobin 15.6 13.0 - 17.0 g/dL   HCT 40.9 81.1 - 91.4 %   MCV 75.6 (L) 78.0 - 100.0 fL   MCH 26.4 26.0 - 34.0 pg   MCHC 35.0 30.0 - 36.0 g/dL   RDW 78.2 95.6 - 21.3 %   Platelets 266 150 - 400 K/uL   Neutrophils Relative % 78 %  Neutro Abs 11.4 (H) 1.7 - 7.7 K/uL   Lymphocytes Relative 13 %   Lymphs Abs 1.9 0.7 - 4.0 K/uL   Monocytes Relative 7 %   Monocytes Absolute 1.0 0.1 - 1.0 K/uL   Eosinophils Relative 1 %   Eosinophils Absolute 0.2 0.0 - 0.7 K/uL   Basophils Relative 1 %   Basophils Absolute 0.1 0.0 - 0.1 K/uL  Basic metabolic panel  Result Value Ref Range   Sodium 135 135 - 145 mmol/L   Potassium 3.3 (L) 3.5 - 5.1 mmol/L   Chloride 100 (L) 101 - 111 mmol/L   CO2 25 22 - 32 mmol/L   Glucose, Bld 91 65 - 99 mg/dL   BUN 15 6 - 20 mg/dL   Creatinine, Ser 1.610.95 0.61 - 1.24 mg/dL   Calcium 9.5 8.9 - 09.610.3 mg/dL   GFR calc non Af Amer >60 >60 mL/min   GFR calc Af Amer >60 >60 mL/min   Anion gap 10 5 - 15   Ct  Abdomen Pelvis W Contrast  07/12/2015  CLINICAL DATA:  Worsening right inguinal hernia, with acute onset of pain. Leukocytosis. Initial encounter. EXAM: CT ABDOMEN AND PELVIS WITH CONTRAST TECHNIQUE: Multidetector CT imaging of the abdomen and pelvis was performed using the standard protocol following bolus administration of intravenous contrast. CONTRAST:  100mL OMNIPAQUE IOHEXOL 300 MG/ML  SOLN COMPARISON:  CT of the abdomen and pelvis performed 01/11/2013. FINDINGS: The visualized lung bases are clear. A nonspecific 1.2 cm hypodensity is noted within the right hepatic lobe. The liver and spleen are otherwise unremarkable. The gallbladder is within normal limits. The pancreas and adrenal glands are unremarkable. The kidneys are unremarkable in appearance. There is no evidence of hydronephrosis. No renal or ureteral stones are seen. No perinephric stranding is appreciated. The small bowel is unremarkable in appearance. The stomach is within normal limits. No acute vascular abnormalities are seen. There is herniation of the mildly redundant hepatic flexure of the colon and proximal ascending colon into a large right inguinal hernia, extending into the scrotum, with associated distention of the herniated segment and surrounding soft tissue inflammation and fluid. The more distal transverse colon is partially decompressed. Findings are compatible with partial obstruction secondary to the herniation. The appendix is unremarkable in appearance, and contains air. The remaining colon is unremarkable in appearance. The bladder is moderately distended and grossly unremarkable. The prostate remains normal in size. No inguinal lymphadenopathy is seen. Mild soft tissue inflammation is noted within the scrotum, more prominent on the right. No acute osseous abnormalities are identified. 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 IMPRESSION: 1. Herniation of the mildly redundant hepatic  flexure of the colon and proximal ascending colon into a large right inguinal hernia, extending into the scrotum, with associated distention of the herniated segment and surrounding soft tissue inflammation and fluid. Partial decompression of the more distal transverse colon. Findings compatible with partial obstruction secondary to the herniation. 2. Mild surrounding soft tissue inflammation noted within the scrotum, more prominent on the right. 3. Nonspecific small 1.2 cm hypodensity within the right hepatic lobe, new from the prior study. Would correlate with LFTs and consider further evaluation as deemed clinically appropriate. 4. 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 These results were called by telephone at the time of interpretation on 07/12/2015 at 4:43 am to [facility] Domique Reardon PA, who verbally acknowledged these results. Electronically Signed   By: Roanna RaiderJeffery  Chang M.D.   On:  07/12/2015 04:44    Imaging Review No results found. I have personally reviewed and evaluated these images and lab results as part of my medical decision-making.   EKG Interpretation None      MDM   Final diagnoses:  None    1. Incarcerated inguinal hernia  Pain is moderately managed. No vomiting, fever. Patient's CT shows incarcerated hernia with partial obstruction of bowel. Findings discussed with Dr. Donell Beers who will see the patient in the ED.    Elpidio Anis, PA-C 07/12/15 0517  Courteney Randall An, MD 07/12/15 [phone]

## 2015-07-12 NOTE — Transfer of Care (Signed)
Immediate Anesthesia Transfer of Care Note  Patient: Andrew Vang  Procedure(s) Performed: Procedure(s): EXPLORATORY LAPAROTOMY (Right) HERNIA REPAIR INGUINAL ADULT (Right)  Patient Location: PACU  Anesthesia Type:General  Level of Consciousness: awake, alert , oriented and patient cooperative  Airway & Oxygen Therapy: Patient Spontanous Breathing and Patient connected to face mask oxygen  Post-op Assessment: Report given to RN, Post -op Vital signs reviewed and stable and Patient moving all extremities X 4  Post vital signs: stable  Last Vitals:  Filed Vitals:   07/12/15 0700  BP: 124/79  Pulse:   Temp:   Resp:     Complications: No apparent anesthesia complications

## 2015-07-12 NOTE — Anesthesia Procedure Notes (Signed)
Procedure Name: Intubation Date/Time: 07/12/2015 8:23 AM Performed by: Illene SilverEVANS, Charelle Petrakis E Pre-anesthesia Checklist: Patient identified, Emergency Drugs available, Suction available and Patient being monitored Patient Re-evaluated:Patient Re-evaluated prior to inductionOxygen Delivery Method: Circle System Utilized Preoxygenation: Pre-oxygenation with 100% oxygen Intubation Type: IV induction Ventilation: Mask ventilation without difficulty Laryngoscope Size: Mac and 4 Grade View: Grade I Tube type: Oral Tube size: 7.5 mm Number of attempts: 1 Airway Equipment and Method: Stylet and Oral airway (RSI with cricoid) Placement Confirmation: ETT inserted through vocal cords under direct vision,  positive ETCO2 and breath sounds checked- equal and bilateral Secured at: 21 cm Tube secured with: Tape Dental Injury: Teeth and Oropharynx as per pre-operative assessment

## 2015-07-12 NOTE — Anesthesia Preprocedure Evaluation (Addendum)
Anesthesia Evaluation  Patient identified by MRN, date of birth, ID band Patient awake    Reviewed: Allergy & Precautions, NPO status   Airway Mallampati: I  TM Distance: >3 FB Neck ROM: Full    Dental  (+) Teeth Intact   Pulmonary Current Smoker,    breath sounds clear to auscultation       Cardiovascular hypertension,  Rhythm:Regular Rate:Normal     Neuro/Psych    GI/Hepatic Hernia incarcerated   Endo/Other  negative endocrine ROS  Renal/GU      Musculoskeletal   Abdominal   Peds  Hematology negative hematology ROS (+)   Anesthesia Other Findings   Reproductive/Obstetrics                            Anesthesia Physical Anesthesia Plan  ASA: II and emergent  Anesthesia Plan: General   Post-op Pain Management:    Induction: Intravenous  Airway Management Planned: Oral ETT  Additional Equipment:   Intra-op Plan:   Post-operative Plan: Extubation in OR  Informed Consent: I have reviewed the patients History and Physical, chart, labs and discussed the procedure including the risks, benefits and alternatives for the proposed anesthesia with the patient or authorized representative who has indicated his/her understanding and acceptance.   Dental advisory given  Plan Discussed with: CRNA and Surgeon  Anesthesia Plan Comments:         Anesthesia Quick Evaluation

## 2015-07-12 NOTE — Op Note (Signed)
Preoperative diagnosis: Incarcerated right inguinal hernia with bowel obstruction Postoperative diagnosis: Same as above Procedure: Right inguinal hernia repair with ultra Pro mesh Surgeon: Dr. Harden MoMatt Annarose Ouellet Anesthesia: Gen. Estimated blood loss: Minimal Locations: None Drains: None Specimens: None Sponge and needle count was correct   Indications: This is a healthy [age] year old male that hasa two-year history of a right groin hernia that has become progressively more symptomatic. He comes in now with an incarcerated right groin hernia with colon in his scrotum and signs and symptoms of bowel obstruction. We discussed proceeding to the operating room.  Procedure: After informed consent was obtained the patient was taken to the operating room. He was given antibiotics. Sequential compression devices were on his legs. He was then placed under general anesthesia without complication. His right groin, scrotum, and abdomen were prepped and draped in the standard sterile surgical fashion after a Foley catheter was introduced. A surgical timeout was then performed.  I injected Marcaine in the right groin as well as performed a tap block. I then made a right groin incision. I was able to identify as inferior epigastric vein and I ligated this with 2-0 silk suture. I then entered the external oblique through the external ring. I encircled his cord as well as the bowel that was incarcerated. I was able to enter into the sac and identified his colon as well as omentum which was incarcerated in his scrotum. There were some thrombosed veins on his omentum as well as some patchy ischemic areas around the fat around his colon. There was some small dusky areas of his colon. Once I released these these looked better. I elected not to resect any of this. I was able to reduce all of this back into his abdomen after carefully inspecting it. Once I had done this I then removed the sac and close this. I then was able to  identify all of this cord structures. I then closed the internal ring with 2-0 Vicryl suture. I then due to the fact that there was not really any contamination and just some fluid present elected to place a piece of UltraPro mesh. I made a patch and laid this on the floor. I sutured this into position to the pubic tubercle several times with a 2-0 Vicryl suture. I made a T cut and wrapped this around the spermatic cord. I secured this every half centimeter to the inguinal ligament below. I laid the lateral portion flat. I sewed the cut ends of the mesh together as well as to the internal oblique. There was no evidence of any additional hernia and this completely completely covered the defect. I then obtained hemostasis. I then closed the external oblique with 2-0 Vicryl. The Scarpa's fascia was closed with 3-0 Vicryl. The skin was closed 4-0 Monocryl. I had evacuated all of the contents of the scrotum which were a fair amount of blood clots present. I then placed globally the incision. He tolerated this well was extubated and transferred to recovery stable.

## 2015-07-12 NOTE — ED Notes (Signed)
Per EMS pt presents d/t a right sided inguinal hernia that he has had x 2 years however has not been able to have this issue taken care of d/t lack of insurance.  Pt reported to EMS that he was trying to sleep but the pain became severe prompting his to call EMS.  Pt is ambulatory from ambulance to room.

## 2015-07-12 NOTE — ED Notes (Signed)
Bed: [id number] Expected date:  Expected time:  Means of arrival:  Comments: 54M hernia

## 2015-07-12 NOTE — Transfer of Care (Signed)
Immediate Anesthesia Transfer of Care Note  Patient: Andrew Vang  Procedure(s) Performed: Procedure(s): EXPLORATORY LAPAROTOMY (Right) HERNIA REPAIR INGUINAL ADULT (Right)  Patient Location: PACU  Anesthesia Type:General  Level of Consciousness: awake, alert , oriented and patient cooperative  Airway & Oxygen Therapy: Patient Spontanous Breathing and Patient connected to face mask oxygen  Post-op Assessment: Report given to RN, Post -op Vital signs reviewed and stable and Patient moving all extremities X 4  Post vital signs: stable  Last Vitals:  Filed Vitals:   07/12/15 1051  BP: 145/93  Pulse: 75  Temp: 36.9 C  Resp: 20    Complications: No apparent anesthesia complications

## 2015-07-12 NOTE — ED Notes (Signed)
Patient transported to CT 

## 2015-07-12 NOTE — Anesthesia Postprocedure Evaluation (Signed)
  Anesthesia Post-op Note  Patient: Andrew Vang  Procedure(s) Performed: Procedure(s): EXPLORATORY LAPAROTOMY (Right) HERNIA REPAIR INGUINAL ADULT (Right)  Patient Location: PACU  Anesthesia Type:General  Level of Consciousness: awake and sedated  Airway and Oxygen Therapy: Patient Spontanous Breathing  Post-op Pain: mild  Post-op Assessment: Post-op Vital signs reviewed              Post-op Vital Signs: stable  Last Vitals:  Filed Vitals:   07/12/15 1030  BP:   Pulse:   Temp: 36.4 C  Resp:     Complications: No apparent anesthesia complications

## 2015-07-12 NOTE — H&P (Signed)
Andrew Vang is an 29 y.o. male.   Chief Complaint: right inguinal hernia HPI:  Pt is a 34 yo M with a 2 year history of right inguinal hernia.  It has been getting incarcerated over the last few months, but normally he has "been able to walk it off" or lay down and rest until it gets better.  Today the pain was even more excruciating than normal and the hernia has stayed down and will not go away.  He also developed nausea and vomiting this time.  He does not have fever or chills.  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    Past Medical History  Diagnosis Date  . Inguinal hernia recurrent bilateral   . Hypertension   . Anxiety   . Depression     Past Surgical History  Procedure Laterality Date  . Brain surgery  2013?    brain bleed    No family history on file. Social History:  reports that he has been smoking.  He has never used smokeless tobacco. He reports that he drinks alcohol. He reports that he uses illicit drugs (Marijuana). Works as a Biomedical scientist.    Allergies: No Known Allergies  Meds:  Ibuprofen.    Results for orders placed or performed during the hospital encounter of 07/12/15 (from the past 48 hour(s))  CBC with Differential     Status: Abnormal   Collection Time: 07/12/15  3:02 AM  Result Value Ref Range   WBC 14.6 (H) 4.0 - 10.5 K/uL   RBC 5.90 (H) 4.22 - 5.81 MIL/uL   Hemoglobin 15.6 13.0 - 17.0 g/dL   HCT 44.6 39.0 - 52.0 %   MCV 75.6 (L) 78.0 - 100.0 fL   MCH 26.4 26.0 - 34.0 pg   MCHC 35.0 30.0 - 36.0 g/dL   RDW 13.3 11.5 - 15.5 %   Platelets 266 150 - 400 K/uL   Neutrophils Relative % 78 %   Neutro Abs 11.4 (H) 1.7 - 7.7 K/uL   Lymphocytes Relative 13 %   Lymphs Abs 1.9 0.7 - 4.0 K/uL   Monocytes Relative 7 %   Monocytes Absolute 1.0 0.1 - 1.0 K/uL   Eosinophils Relative 1 %   Eosinophils Absolute 0.2 0.0 - 0.7 K/uL   Basophils Relative 1 %   Basophils Absolute 0.1 0.0  - 0.1 K/uL  Basic metabolic panel     Status: Abnormal   Collection Time: 07/12/15  3:02 AM  Result Value Ref Range   Sodium 135 135 - 145 mmol/L   Potassium 3.3 (L) 3.5 - 5.1 mmol/L   Chloride 100 (L) 101 - 111 mmol/L   CO2 25 22 - 32 mmol/L   Glucose, Bld 91 65 - 99 mg/dL   BUN 15 6 - 20 mg/dL   Creatinine, Ser 0.95 0.61 - 1.24 mg/dL   Calcium 9.5 8.9 - 10.3 mg/dL   GFR calc non Af Amer >60 >60 mL/min   GFR calc Af Amer >60 >60 mL/min    Comment: (NOTE) The eGFR has been calculated using the CKD EPI equation. This calculation has not been validated in all clinical situations. eGFR's persistently <60 mL/min signify possible Chronic Kidney Disease.    Anion gap 10 5 - 15   Ct Abdomen Pelvis W Contrast  07/12/2015  CLINICAL DATA:  Worsening right inguinal hernia, with acute onset of pain.  Leukocytosis. Initial encounter. EXAM: CT ABDOMEN AND PELVIS WITH CONTRAST TECHNIQUE: Multidetector CT imaging of the abdomen and pelvis was performed using the standard protocol following bolus administration of intravenous contrast. CONTRAST:  140m OMNIPAQUE IOHEXOL 300 MG/ML  SOLN COMPARISON:  CT of the abdomen and pelvis performed 01/11/2013. FINDINGS: The visualized lung bases are clear. A nonspecific 1.2 cm hypodensity is noted within the right hepatic lobe. The liver and spleen are otherwise unremarkable. The gallbladder is within normal limits. The pancreas and adrenal glands are unremarkable. The kidneys are unremarkable in appearance. There is no evidence of hydronephrosis. No renal or ureteral stones are seen. No perinephric stranding is appreciated. The small bowel is unremarkable in appearance. The stomach is within normal limits. No acute vascular abnormalities are seen. There is herniation of the mildly redundant hepatic flexure of the colon and proximal ascending colon into a large right inguinal hernia, extending into the scrotum, with associated distention of the herniated segment and  surrounding soft tissue inflammation and fluid. The more distal transverse colon is partially decompressed. Findings are compatible with partial obstruction secondary to the herniation. The appendix is unremarkable in appearance, and contains air. The remaining colon is unremarkable in appearance. The bladder is moderately distended and grossly unremarkable. The prostate remains normal in size. No inguinal lymphadenopathy is seen. Mild soft tissue inflammation is noted within the scrotum, more prominent on the right. No acute osseous abnormalities are identified. 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 IMPRESSION: 1. Herniation of the mildly redundant hepatic flexure of the colon and proximal ascending colon into a large right inguinal hernia, extending into the scrotum, with associated distention of the herniated segment and surrounding soft tissue inflammation and fluid. Partial decompression of the more distal transverse colon. Findings compatible with partial obstruction secondary to the herniation. 2. Mild surrounding soft tissue inflammation noted within the scrotum, more prominent on the right. 3. Nonspecific small 1.2 cm hypodensity within the right hepatic lobe, new from the prior study. Would correlate with LFTs and consider further evaluation as deemed clinically appropriate. 4. 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 These results were called by telephone at the time of interpretation on 07/12/2015 at 4:43 am to SWeeki WacheePA, who verbally acknowledged these results. Electronically Signed   By: JGarald BaldingM.D.   On: 07/12/2015 04:44    Review of Systems  Constitutional: Negative.   HENT: Negative.   Eyes: Negative.   Respiratory: Negative.   Cardiovascular: Negative.   Gastrointestinal: Positive for nausea, vomiting and abdominal pain. Negative for diarrhea, constipation and blood in stool.  Genitourinary:       Groin  pain  Musculoskeletal: Negative.   Skin: Negative.   Neurological: Negative.   Endo/Heme/Allergies: Negative.   Psychiatric/Behavioral: Negative.     Blood pressure 128/91, pulse 86, temperature 98.2 F (36.8 C), temperature source Oral, resp. rate 20, height 5' 9"  (1.753 m), weight 81.647 kg (180 lb), SpO2 100 %. Physical Exam  Constitutional: He is oriented to person, place, and time. He appears well-developed and well-nourished. He appears distressed.  HENT:  Head: Normocephalic and atraumatic.  Eyes: Conjunctivae are normal. No scleral icterus.  Neck: Neck supple. No thyromegaly present.  Cardiovascular: Normal rate and intact distal pulses.   Respiratory: Effort normal. No respiratory distress.  GI: Soft. He exhibits distension (mildly distended). There is tenderness (at groin.  no abdominal tenderness). There is no rebound and no guarding. A hernia is present. Hernia  confirmed positive in the right inguinal area (incarcerated.  tender.  no overlying skin changes.).  Musculoskeletal: Normal range of motion.  Neurological: He is alert and oriented to person, place, and time.  Skin: Skin is warm and dry. No rash noted. He is not diaphoretic. No erythema. No pallor.  Psychiatric: He has a normal mood and affect. His behavior is normal. Judgment and thought content normal.     Assessment/Plan Incarcerated strangulated right inguinal hernia.  Pt will need operative repair.  Discussed with patient that he will likely need exploratory laparotomy in addition to groin incision because he appears to have bowel that may be compromised.  He may need bowel resection.   Would not plan mesh placement due to possible bowel resection.   Reviewed risks of surgery briefly as well as post op restrictions.  Discussed that recovery and time in hospital depend on how extensive surgery is.   He needs IV fluid resuscitation and potassium repletion for hypokalemia.    Annalisia Ingber 07/12/2015, 6:25  AM

## 2015-07-13 LAB — CBC
HCT: 40.4 % (ref 39.0–52.0)
Hemoglobin: 13.4 g/dL (ref 13.0–17.0)
MCH: 25.4 pg — AB (ref 26.0–34.0)
MCHC: 33.2 g/dL (ref 30.0–36.0)
MCV: 76.7 fL — AB (ref 78.0–100.0)
PLATELETS: 238 10*3/uL (ref 150–400)
RBC: 5.27 MIL/uL (ref 4.22–5.81)
RDW: 13.5 % (ref 11.5–15.5)
WBC: 14 10*3/uL — ABNORMAL HIGH (ref 4.0–10.5)

## 2015-07-13 LAB — BASIC METABOLIC PANEL
Anion gap: 6 (ref 5–15)
BUN: 7 mg/dL (ref 6–20)
CHLORIDE: 101 mmol/L (ref 101–111)
CO2: 30 mmol/L (ref 22–32)
CREATININE: 1.05 mg/dL (ref 0.61–1.24)
Calcium: 8.8 mg/dL — ABNORMAL LOW (ref 8.9–10.3)
GFR calc Af Amer: >60 mL/min (ref >60)
GFR calc non Af Amer: >60 mL/min (ref >60)
GLUCOSE: 115 mg/dL — AB (ref 65–99)
Potassium: 4 mmol/L (ref 3.5–5.1)
Sodium: 137 mmol/L (ref 135–145)

## 2015-07-13 NOTE — Progress Notes (Signed)
1 Day Post-Op  Subjective: Some flatus, pain controlled, no complaints, discussed procedure  Objective: Vital signs in last 24 hours: Temp:  [97.5 F (36.4 C)-98.4 F (36.9 C)] 98.1 F (36.7 C) (10/30 0500) Pulse Rate:  [63-75] 65 (10/30 0500) Resp:  [14-20] 16 (10/30 0500) BP: (106-151)/(66-95) 108/66 mmHg (10/30 0500) SpO2:  [98 %-100 %] 98 % (10/30 0500) Last BM Date: 07/10/15  Intake/Output from previous day: 10/29 0701 - 10/30 0700 In: 1820 [P.O.:120; I.V.:1700] Out: 3580 [Urine:3430; Blood:150] Intake/Output this shift:    General appearance: no distress Resp: clear to auscultation bilaterally Cardio: regular rate and rhythm GI: soft nt/nd bs present Male genitalia: normal, minimal scrotal edema today  Lab Results:   Recent Labs  07/12/15 0302 07/13/15 0600  WBC 14.6* 14.0*  HGB 15.6 13.4  HCT 44.6 40.4  PLT 266 238   BMET  Recent Labs  07/12/15 0302 07/13/15 0600  NA 135 137  K 3.3* 4.0  CL 100* 101  CO2 25 30  GLUCOSE 91 115*  BUN 15 7  CREATININE 0.95 1.05  CALCIUM 9.5 8.8*   PT/INR No results for input(s): LABPROT, INR in the last 72 hours. ABG No results for input(s): PHART, HCO3 in the last 72 hours.  Invalid input(s): PCO2, PO2  Studies/Results: Ct Abdomen Pelvis W Contrast  07/12/2015  CLINICAL DATA:  Worsening right inguinal hernia, with acute onset of pain. Leukocytosis. Initial encounter. EXAM: CT ABDOMEN AND PELVIS WITH CONTRAST TECHNIQUE: Multidetector CT imaging of the abdomen and pelvis was performed using the standard protocol following bolus administration of intravenous contrast. CONTRAST:  OMNIPAQUE IOHEXOL 300 MG/ML  SOLN COMPARISON:  CT of the abdomen and pelvis performed 01/11/2013. FINDINGS: The visualized lung bases are clear. A nonspecific 1.2 cm hypodensity is noted within the right hepatic lobe. The liver and spleen are otherwise unremarkable. The gallbladder is within normal limits. The pancreas and adrenal  glands are unremarkable. The kidneys are unremarkable in appearance. There is no evidence of hydronephrosis. No renal or ureteral stones are seen. No perinephric stranding is appreciated. The small bowel is unremarkable in appearance. The stomach is within normal limits. No acute vascular abnormalities are seen. There is herniation of the mildly redundant hepatic flexure of the colon and proximal ascending colon into a large right inguinal hernia, extending into the scrotum, with associated distention of the herniated segment and surrounding soft tissue inflammation and fluid. The more distal transverse colon is partially decompressed. Findings are compatible with partial obstruction secondary to the herniation. The appendix is unremarkable in appearance, and contains air. The remaining colon is unremarkable in appearance. The bladder is moderately distended and grossly unremarkable. The prostate remains normal in size. No inguinal lymphadenopathy is seen. Mild soft tissue inflammation is noted within the scrotum, more prominent on the right. No acute osseous abnormalities are identified. 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 IMPRESSION: 1. Herniation of the mildly redundant hepatic flexure of the colon and proximal ascending colon into a large right inguinal hernia, extending into the scrotum, with associated distention of the herniated segment and surrounding soft tissue inflammation and fluid. Partial decompression of the more distal transverse colon. Findings compatible with partial obstruction secondary to the herniation. 2. Mild surrounding soft tissue inflammation noted within the scrotum, more prominent on the right. 3. Nonspecific small 1.2 cm hypodensity within the right hepatic lobe, new from the prior study. Would correlate with LFTs and consider further evaluation as deemed clinically appropriate.  4. 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 These results were called by telephone at the time of interpretation on 07/12/2015 at 4:43 am to [facility]I UPSTILL PA, who verbally acknowledged these results. Electronically Signed   By: Roanna RaiderJeffery  Chang M.D.   On: 07/12/2015 04:44    Anti-infectives: Anti-infectives    Start     Dose/Rate Route Frequency Ordered Stop   07/12/15 1400  ceFAZolin (ANCEF) IVPB 2 g/50 mL premix  Status:  Discontinued     2 g 100 mL/hr over 30 Minutes Intravenous 3 times per day 07/12/15 1001 07/12/15 1012   07/12/15 1400  ceFAZolin (ANCEF) IVPB 2 g/50 mL premix     2 g 100 mL/hr over 30 Minutes Intravenous 3 times per day 07/12/15 1050 07/13/15 0633   07/12/15 1400  ceFAZolin (ANCEF) IVPB 2 g/50 mL premix  Status:  Discontinued     2 g 100 mL/hr over 30 Minutes Intravenous 3 times per day 07/12/15 1012 07/12/15 1043   07/12/15 0645  ceFAZolin (ANCEF) IVPB 2 g/50 mL premix     2 g 100 mL/hr over 30 Minutes Intravenous On call to O.R. 07/12/15 0636 07/12/15 0804      Assessment/Plan: POD 1 rih repair for incarcerated hernia  1. Po pain meds with iv backup 2. pulm toilet, oob, ics 3. Will advance diet as tolerated, his colon had evidence of ischemia that improved once released but this will need to be monitored 4. Dc foley 5. lovenox scds 6. Will recheck cbc in am hopefully wbc going down, possibly home in am [facility],Emileigh Kellett 07/13/2015

## 2015-07-14 ENCOUNTER — Encounter (HOSPITAL_COMMUNITY): Payer: Self-pay | Admitting: General Surgery

## 2015-07-14 ENCOUNTER — Encounter: Payer: Self-pay | Admitting: General Surgery

## 2015-07-14 LAB — CBC
HEMATOCRIT: 37.8 % — AB (ref 39.0–52.0)
HEMOGLOBIN: 12.5 g/dL — AB (ref 13.0–17.0)
MCH: 25.9 pg — AB (ref 26.0–34.0)
MCHC: 33.1 g/dL (ref 30.0–36.0)
MCV: 78.3 fL (ref 78.0–100.0)
Platelets: 229 10*3/uL (ref 150–400)
RBC: 4.83 MIL/uL (ref 4.22–5.81)
RDW: 13.4 % (ref 11.5–15.5)
WBC: 9.2 10*3/uL (ref 4.0–10.5)

## 2015-07-14 MED ORDER — OXYCODONE HCL 5 MG PO TABS: 5.0000 mg | ORAL_TABLET | ORAL | Status: DC | PRN

## 2015-07-14 MED ORDER — IBUPROFEN 200 MG PO TABS: 600.0000 mg | ORAL_TABLET | Freq: Four times a day (QID) | ORAL | Status: DC | PRN

## 2015-07-14 MED ORDER — ACETAMINOPHEN 325 MG PO TABS: 650.0000 mg | ORAL_TABLET | Freq: Four times a day (QID) | ORAL | Status: AC | PRN

## 2015-07-14 MED ORDER — IBUPROFEN 200 MG PO TABS: ORAL_TABLET | ORAL | Status: AC

## 2015-07-14 NOTE — Care Management Note (Signed)
Case Management Note  Patient Details  Name: Andrew Vang MRN: [id number] Date of Birth: 10/31/1985  Subjective/Objective:       S/p Right inguinal hernia repair, partial bowel obstruction             Action/Plan: Discharge planning, no new needs identified  Expected Discharge Date:                  Expected Discharge Plan:  Home/Self Care  In-House Referral:  NA  Discharge planning Services  NA  Post Acute Care Choice:  NA Choice offered to:  NA  DME Arranged:  N/A DME Agency:  NA  HH Arranged:  NA HH Agency:  NA  Status of Service:  Completed, signed off  Medicare Important Message Given:    Date Medicare IM Given:    Medicare IM give by:    Date Additional Medicare IM Given:    Additional Medicare Important Message give by:     If discussed at Long Length of Stay Meetings, dates discussed:    Additional Comments:  Alexis Goodelleele, Heavenlee Maiorana K, RN 07/14/2015, 11:20 AM

## 2015-07-14 NOTE — Discharge Instructions (Signed)
Inguinal Hernia, Adult , Care After Refer to this sheet in the next few weeks. These discharge instructions provide you with general information on caring for yourself after you leave the hospital. Your caregiver may also give you specific instructions. Your treatment has been planned according to the most current medical practices available, but unavoidable complications sometimes occur. If you have any problems or questions after discharge, please call your caregiver. HOME CARE INSTRUCTIONS  Put ice on the operative site.  Put ice in a plastic bag.  Place a towel between your skin and the bag.  Leave the ice on for 15-20 minutes at a time, 03-04 times a day while awake.  Change bandages (dressings) as directed.  Keep the wound dry and clean. The wound may be washed gently with soap and water. Gently blot or dab the wound dry. It is okay to take showers 24 to 48 hours after surgery. Do not take baths, use swimming pools, or use hot tubs for 10 days, or as directed by your caregiver.  You can shower.  Only take over-the-counter or prescription medicines for pain, discomfort, or fever as directed by your caregiver.  Continue your normal diet as directed.  Do not lift anything more than 10 pounds or play contact sports for 3 weeks, or as directed. SEEK MEDICAL CARE IF:  There is redness, swelling, or increasing pain in the wound.  There is fluid (pus) coming from the wound.  There is drainage from a wound lasting longer than 1 day.  You have an oral temperature above 102 F (38.9 C).  You notice a bad smell coming from the wound or dressing.  The wound breaks open after the stitches (sutures) have been removed.  You notice increasing pain in the shoulders (shoulder strap areas).  You develop dizzy episodes or fainting while standing.  You feel sick to your stomach (nauseous) or throw up (vomit). SEEK IMMEDIATE MEDICAL CARE IF:  You develop a rash.  You have difficulty  breathing.  You develop a reaction or have side effects to medicines you were given. MAKE SURE YOU:   Understand these instructions.  Will watch your condition.  Will get help right away if you are not doing well or get worse.   This information is not intended to replace advice given to you by your health care provider. Make sure you discuss any questions you have with your health care provider.   Document Released: 09/30/2006 Document Revised: 09/20/2014 Document Reviewed: 03/03/2015 Elsevier Interactive Patient Education 2016 [employer]  CCS _______[facility]  UMBILICAL OR INGUINAL HERNIA REPAIR: POST OP INSTRUCTIONS  Always review your discharge instruction sheet given to you by the facility where your surgery was performed. IF YOU HAVE DISABILITY OR FAMILY LEAVE FORMS, YOU MUST BRING THEM TO THE OFFICE FOR PROCESSING.   DO NOT GIVE THEM TO YOUR DOCTOR.  1. A  prescription for pain medication may be given to you upon discharge.  Take your pain medication as prescribed, if needed.  If narcotic pain medicine is not needed, then you may take acetaminophen (Tylenol) or ibuprofen (Advil) as needed. 2. Take your usually prescribed medications unless otherwise directed. 3. If you need a refill on your pain medication, please contact your pharmacy.  They will contact our office to request authorization. Prescriptions will not be filled after 5 pm or on week-ends. 4. You should follow a light diet the first 24 hours after arrival home, such as soup and crackers, etc.  Be sure to include lots of fluids daily.  Resume your normal diet the day after surgery. 5. Most patients will experience some swelling and bruising around the umbilicus or in the groin and scrotum.  Ice packs and reclining will help.  Swelling and bruising can take several days to resolve.  6. It is common to experience some constipation if taking pain medication after surgery.  Increasing fluid intake and  taking a stool softener (such as Colace) will usually help or prevent this problem from occurring.  A mild laxative (Milk of Magnesia or Miralax) should be taken according to package directions if there are no bowel movements after 48 hours. 7. Unless discharge instructions indicate otherwise, you may remove your bandages 24-48 hours after surgery, and you may shower at that time.  You may have steri-strips (small skin tapes) in place directly over the incision.  These strips should be left on the skin for 7-10 days.  If your surgeon used skin glue on the incision, you may shower in 24 hours.  The glue will flake off over the next 2-3 weeks.  Any sutures or staples will be removed at the office during your follow-up visit. 8. ACTIVITIES:  You may resume regular (light) daily activities beginning the next day--such as daily self-care, walking, climbing stairs--gradually increasing activities as tolerated.  You may have sexual intercourse when it is comfortable.  Refrain from any heavy lifting or straining until approved by your doctor. a. You may drive when you are no longer taking prescription pain medication, you can comfortably wear a seatbelt, and you can safely maneuver your car and apply brakes. b. RETURN TO WORK:  __________________________________________________________ 9. You should see your doctor in the office for a follow-up appointment approximately 2-3 weeks after your surgery.  Make sure that you call for this appointment within a day or two after you arrive home to insure a convenient appointment time. 10. OTHER INSTRUCTIONS:  __________________________________________________________________________________________________________________________________________________________________________________________  WHEN TO CALL YOUR DOCTOR: 1. Fever over 101.0 2. Inability to urinate 3. Nausea and/or vomiting 4. Extreme swelling or bruising 5. Continued bleeding from incision. 6. Increased  pain, redness, or drainage from the incision  The clinic staff is available to answer your questions during regular business hours.  Please dont hesitate to call and ask to speak to one of the nurses for clinical concerns.  If you have a medical emergency, go to the nearest emergency room or call 911.  A surgeon from [facility] is always on call at the hospital   [address], [address], [place], [place]  [address] ?  [address] [address], [place], [place]   [address] [phone] ? [phone] ? FAX [phone] Web site: [email or web address]

## 2015-07-14 NOTE — Progress Notes (Signed)
Physician Discharge Summary  Patient ID: Andrew Vang MRN: [id number] DOB/AGE: 31/01/1986 31 y.o.  Admit date: 07/12/2015 Discharge date: 07/14/2015  Admission Diagnoses:  Incarcerated strangulated right inguinal hernia.  Discharge Diagnoses:  Incarcerated right inguinal hernia with bowel obstruction Hx of hypertension Hx of anxiety/Depression Active Problems:   Incarcerated inguinal hernia   PROCEDURES: S/p Right inguinal hernia repair with ultra Pro mesh, 07/12/15, Dr. Charm RingsMatthew Wakefield   Hospital Course:   29 yo M with a 2 year history of right inguinal hernia. It has been getting incarcerated over the last few months, but normally he has "been able to walk it off" or lay down and rest until it gets better. Today the pain was even more excruciating than normal and the hernia has stayed down and will not go away. He also developed nausea and vomiting this time. He does not have fever or chills. 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  He was admitted by Dr. Donell BeersByerly and taken to the OR latter that day by Dr. Dwain SarnaWakefield.  Post op he has done well is tolerating a soft diet, having flatus and is ready for discharge home this AM.  He works in [employer], as a Investment banker, operationalchef.  He has instruction not to lift over 10 pounds for 4 weeks or cleared by Dr. Dwain SarnaWakefield.  Follow up as below.  Condition on d/c:  Improved   Disposition: 01-Home or Self Care     Medication List    TAKE these medications        acetaminophen 325 MG tablet  Commonly known as:  TYLENOL  Take 2 tablets (650 mg total) by mouth every 6 (six) hours as needed for mild pain (or temp > 100).     ibuprofen 200 MG tablet  Commonly known as:  ADVIL,MOTRIN  You can take 2-3 tablets every 6 hours as needed for pain.     oxyCODONE 5 MG immediate release tablet  Commonly known as:  Oxy IR/ROXICODONE  Take 1-2 tablets (5-10 mg total) by  mouth every 4 (four) hours as needed for moderate pain.           Follow-up Information    [facility]. Schedule an appointment as soon as possible for a visit in 2 weeks.   Specialty:  General Surgery   Contact information:   [address] [place] [address] [phone]       Signed: Sherrie GeorgeJENNINGS,Izacc Demeyer 07/14/2015, 2:08 PM

## 2015-07-14 NOTE — Progress Notes (Signed)
Patient tolerating regular diet, pain controlled with oral pain meds, voiding and passing gas.  Discharge instructions reviewed with patient.  Prescriptions given to patient.  Patient to be discharged to home when transportation is here.

## 2015-07-14 NOTE — Progress Notes (Signed)
2 Days Post-Op  Subjective: He feels fine, eating regular diet, passing flatus, wound looks great.   Objective: Vital signs in last 24 hours: Temp:  [98.2 F (36.8 C)-99.5 F (37.5 C)] 99.4 F (37.4 C) (10/31 0541) Pulse Rate:  [78-96] 90 (10/31 0541) Resp:  [16] 16 (10/31 0541) BP: (96-111)/(60-71) 106/68 mmHg (10/31 0541) SpO2:  [98 %-100 %] 98 % (10/31 0541) Last BM Date: 07/10/15 360 PO recorded,  700 urine recorded Regular diet Afebrile, TM 99.5 WBC 9.2 Intake/Output from previous day: 10/30 0701 - 10/31 0700 In: 3420 [P.O.:360; I.V.:3060] Out: 700 [Urine:700] Intake/Output this shift: Total I/O In: -  Out: 1300 [Urine:1300]  General appearance: alert, cooperative and no distress Resp: clear to auscultation bilaterally GI: soft, sore, incision looks good, + BS, and flatus.  Lab Results:   Recent Labs  07/13/15 0600 07/14/15 0010  WBC 14.0* 9.2  HGB 13.4 12.5*  HCT 40.4 37.8*  PLT 238 229    BMET  Recent Labs  07/12/15 0302 07/13/15 0600  NA 135 137  K 3.3* 4.0  CL 100* 101  CO2 25 30  GLUCOSE 91 115*  BUN 15 7  CREATININE 0.95 1.05  CALCIUM 9.5 8.8*   PT/INR No results for input(s): LABPROT, INR in the last 72 hours.  No results for input(s): AST, ALT, ALKPHOS, BILITOT, PROT, ALBUMIN in the last 168 hours.   Lipase  No results found for: LIPASE   Studies/Results: No results found.  Medications: . docusate sodium  100 mg Oral BID  . enoxaparin (LOVENOX) injection  40 mg Subcutaneous Q24H  . Influenza vac split quadrivalent PF  0.5 mL Intramuscular Tomorrow-1000   . dextrose 5 % and 0.45 % NaCl with KCl 20 mEq/L 50 mL/hr at 07/13/15 0704   Prior to Admission medications   Medication Sig Start Date End Date Taking? Authorizing Provider  ibuprofen (ADVIL,MOTRIN) 200 MG tablet Take 400 mg by mouth every 6 (six) hours as needed (for pain.).   Yes Historical Provider, MD     Assessment/Plan Incarcerated right inguinal hernia with  bowel obstruction S/p Right inguinal hernia repair with ultra Pro mesh, 07/12/15, Dr. Emelia LoronMatthew Wakefield Hx of hypertension Hx of anxiety/Depression Antibiotics:  None DVT:  Lovenox/SCD  Plan:  Home today, follow up with Dr. Dwain SarnaWakefield.      LOS: 2 days    Shahzain Kiester 07/14/2015

## 2015-07-15 NOTE — Discharge Summary (Signed)
Attestation signed by Andrew KelpHaywood Ingram, MD at 07/14/2015 4:24 PM  Patient interviewed and examined by me today. Agree with discharge plans and followup.  Angelia MouldHaywood M. Derrell Vang, M.D., [facility] General and Minimally invasive Surgery Breast and Colorectal Surgery Office: [phone]      Expand All Collapse All   Physician Discharge Summary  Patient ID: Andrew Vang MRN: [id number] DOB/AGE: 57/01/1986 29 y.o.  Admit date: 07/12/2015 Discharge date: 07/14/2015  Admission Diagnoses:  Incarcerated strangulated right inguinal hernia.  Discharge Diagnoses:  Incarcerated right inguinal hernia with bowel obstruction Hx of hypertension Hx of anxiety/Depression Active Problems:  Incarcerated inguinal hernia   PROCEDURES: S/p Right inguinal hernia repair with ultra Pro mesh, 07/12/15, Dr. Charm RingsMatthew Vang   Hospital Course: 29 yo M with a 2 year history of right inguinal hernia. It has been getting incarcerated over the last few months, but normally he has "been able to walk it off" or lay down and rest until it gets better. Today the pain was even more excruciating than normal and the hernia has stayed down and will not go away. He also developed nausea and vomiting this time. He does not have fever or chills. 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  He was admitted by Dr. Donell Vang and taken to the OR latter that day by Dr. Dwain Vang. Post op he has done well is tolerating a soft diet, having flatus and is ready for discharge home this AM. He works in [employer], as a Investment banker, operationalchef. He has instruction not to lift over 10 pounds for 4 weeks or cleared by Dr. Dwain Vang.  Follow up as below.  Condition on d/c: Improved   Disposition: 01-Home or Self Care     Medication List    TAKE these medications       acetaminophen 325 MG tablet  Commonly known as:  TYLENOL  Take 2 tablets (650 mg total) by mouth every 6 (six) hours as needed for mild pain (or temp > 100).     ibuprofen 200 MG tablet  Commonly known as: ADVIL,MOTRIN  You can take 2-3 tablets every 6 hours as needed for pain.     oxyCODONE 5 MG immediate release tablet  Commonly known as: Oxy IR/ROXICODONE  Take 1-2 tablets (5-10 mg total) by mouth every 4 (four) hours as needed for moderate pain.           Follow-up Information    [facility]. Schedule an appointment as soon as possible for a visit in 2 weeks.   Specialty: General Surgery   Contact information:   [address] [place] [address] [phone]       Signed: Sherrie GeorgeJENNINGS,Eulalah Vang 07/14/2015, 2:08 PM         Cosigned by: Andrew KelpHaywood Ingram, MD at 07/14/2015 4:24 PM  Revision History

## 2017-05-25 ENCOUNTER — Observation Stay (HOSPITAL_COMMUNITY): Payer: Self-pay | Admitting: Registered Nurse

## 2017-05-25 ENCOUNTER — Encounter (HOSPITAL_COMMUNITY): Payer: Self-pay

## 2017-05-25 ENCOUNTER — Encounter (HOSPITAL_COMMUNITY)
Admission: EM | Disposition: A | Discharge status: Home / Self Care | Payer: Self-pay | Source: Home / Self Care | Attending: Emergency Medicine

## 2017-05-25 ENCOUNTER — Observation Stay (HOSPITAL_COMMUNITY)
Admission: EM | Admit: 2017-05-25 | Discharge: 2017-05-26 | Disposition: A | Discharge status: Home / Self Care | Payer: Self-pay | Attending: Surgery | Admitting: Surgery

## 2017-05-25 DIAGNOSIS — F329 Major depressive disorder, single episode, unspecified: Secondary | ICD-10-CM | POA: Insufficient documentation

## 2017-05-25 DIAGNOSIS — F17200 Nicotine dependence, unspecified, uncomplicated: Secondary | ICD-10-CM | POA: Insufficient documentation

## 2017-05-25 DIAGNOSIS — I10 Essential (primary) hypertension: Secondary | ICD-10-CM | POA: Insufficient documentation

## 2017-05-25 DIAGNOSIS — Z79899 Other long term (current) drug therapy: Secondary | ICD-10-CM | POA: Insufficient documentation

## 2017-05-25 DIAGNOSIS — F419 Anxiety disorder, unspecified: Secondary | ICD-10-CM | POA: Insufficient documentation

## 2017-05-25 DIAGNOSIS — K4030 Unilateral inguinal hernia, with obstruction, without gangrene, not specified as recurrent: Principal | ICD-10-CM

## 2017-05-25 HISTORY — PX: INSERTION OF MESH: SHX5868

## 2017-05-25 HISTORY — PX: INGUINAL HERNIA REPAIR: SHX194

## 2017-05-25 LAB — CBC
HCT: 40.4 % (ref 39.0–52.0)
HCT: 41.7 % (ref 39.0–52.0)
Hemoglobin: 14.2 g/dL (ref 13.0–17.0)
Hemoglobin: 14.2 g/dL (ref 13.0–17.0)
MCH: 25.8 pg — AB (ref 26.0–34.0)
MCH: 25.8 pg — ABNORMAL LOW (ref 26.0–34.0)
MCHC: 34.1 g/dL (ref 30.0–36.0)
MCHC: 35.1 g/dL (ref 30.0–36.0)
MCV: 73.3 fL — AB (ref 78.0–100.0)
MCV: 75.8 fL — AB (ref 78.0–100.0)
PLATELETS: 264 10*3/uL (ref 150–400)
PLATELETS: 276 10*3/uL (ref 150–400)
RBC: 5.5 MIL/uL (ref 4.22–5.81)
RBC: 5.51 MIL/uL (ref 4.22–5.81)
RDW: 13.9 % (ref 11.5–15.5)
RDW: 14.2 % (ref 11.5–15.5)
WBC: 13.6 10*3/uL — ABNORMAL HIGH (ref 4.0–10.5)
WBC: 22.4 10*3/uL — AB (ref 4.0–10.5)

## 2017-05-25 LAB — COMPREHENSIVE METABOLIC PANEL
ALT: 19 U/L (ref 17–63)
AST: 20 U/L (ref 15–41)
Albumin: 4.4 g/dL (ref 3.5–5.0)
Alkaline Phosphatase: 58 U/L (ref 38–126)
Anion gap: 7 (ref 5–15)
BUN: 15 mg/dL (ref 6–20)
CHLORIDE: 102 mmol/L (ref 101–111)
CO2: 28 mmol/L (ref 22–32)
CREATININE: 0.96 mg/dL (ref 0.61–1.24)
Calcium: 9.2 mg/dL (ref 8.9–10.3)
GFR calc Af Amer: >60 mL/min (ref >60)
GFR calc non Af Amer: >60 mL/min (ref >60)
GLUCOSE: 145 mg/dL — AB (ref 65–99)
Potassium: 3.6 mmol/L (ref 3.5–5.1)
SODIUM: 137 mmol/L (ref 135–145)
Total Bilirubin: 0.5 mg/dL (ref 0.3–1.2)
Total Protein: 7.6 g/dL (ref 6.5–8.1)

## 2017-05-25 LAB — URINALYSIS, ROUTINE W REFLEX MICROSCOPIC
Bilirubin Urine: NEGATIVE
GLUCOSE, UA: NEGATIVE mg/dL
HGB URINE DIPSTICK: NEGATIVE
Ketones, ur: NEGATIVE mg/dL
Leukocytes, UA: NEGATIVE
Nitrite: NEGATIVE
PROTEIN: NEGATIVE mg/dL
Specific Gravity, Urine: 1.005 (ref 1.005–1.030)
pH: 7 (ref 5.0–8.0)

## 2017-05-25 LAB — LIPASE, BLOOD: LIPASE: 35 U/L (ref 11–51)

## 2017-05-25 LAB — CREATININE, SERUM
Creatinine, Ser: 0.94 mg/dL (ref 0.61–1.24)
GFR calc non Af Amer: >60 mL/min (ref >60)

## 2017-05-25 SURGERY — REPAIR, HERNIA, INGUINAL, INCARCERATED
Anesthesia: General | Laterality: Left

## 2017-05-25 MED ORDER — DIPHENHYDRAMINE HCL 12.5 MG/5ML PO ELIX: 12.5000 mg | ORAL_SOLUTION | Freq: Four times a day (QID) | ORAL | Status: DC | PRN

## 2017-05-25 MED ORDER — SENNOSIDES-DOCUSATE SODIUM 8.6-50 MG PO TABS: 1.0000 | ORAL_TABLET | Freq: Every evening | ORAL | Status: DC | PRN

## 2017-05-25 MED ORDER — DEXAMETHASONE SODIUM PHOSPHATE 10 MG/ML IJ SOLN
INTRAMUSCULAR | Status: DC | PRN
  Administered 2017-05-25: 10 mg via INTRAVENOUS

## 2017-05-25 MED ORDER — 0.9 % SODIUM CHLORIDE (POUR BTL) OPTIME
TOPICAL | Status: DC | PRN
  Administered 2017-05-25: 1000 mL

## 2017-05-25 MED ORDER — BUPIVACAINE LIPOSOME 1.3 % IJ SUSP
INTRAMUSCULAR | Status: DC | PRN
  Administered 2017-05-25: 20 mL

## 2017-05-25 MED ORDER — ONDANSETRON HCL 4 MG/2ML IJ SOLN: 4.0000 mg | Freq: Four times a day (QID) | INTRAMUSCULAR | Status: DC | PRN

## 2017-05-25 MED ORDER — HYDROCODONE-ACETAMINOPHEN 5-325 MG PO TABS
1.0000 | ORAL_TABLET | ORAL | Status: DC | PRN
  Administered 2017-05-25: 2 via ORAL
  Filled 2017-05-25: qty 2

## 2017-05-25 MED ORDER — DEXAMETHASONE SODIUM PHOSPHATE 10 MG/ML IJ SOLN
INTRAMUSCULAR | Status: AC
  Filled 2017-05-25: qty 1

## 2017-05-25 MED ORDER — KCL IN DEXTROSE-NACL 20-5-0.45 MEQ/L-%-% IV SOLN
INTRAVENOUS | Status: DC
  Administered 2017-05-25: 16:00:00 via INTRAVENOUS
  Filled 2017-05-25 (×2): qty 1000

## 2017-05-25 MED ORDER — LIDOCAINE 2% (20 MG/ML) 5 ML SYRINGE
INTRAMUSCULAR | Status: AC
  Filled 2017-05-25: qty 5

## 2017-05-25 MED ORDER — NICOTINE 14 MG/24HR TD PT24
14.0000 mg | MEDICATED_PATCH | Freq: Every day | TRANSDERMAL | Status: DC
  Administered 2017-05-25: 14 mg via TRANSDERMAL
  Filled 2017-05-25: qty 1

## 2017-05-25 MED ORDER — ONDANSETRON 4 MG PO TBDP
4.0000 mg | ORAL_TABLET | Freq: Once | ORAL | Status: AC | PRN
  Administered 2017-05-25: 4 mg via ORAL
  Filled 2017-05-25: qty 1

## 2017-05-25 MED ORDER — SODIUM CHLORIDE 0.9 % IV BOLUS (SEPSIS)
1000.0000 mL | Freq: Once | INTRAVENOUS | Status: AC
  Administered 2017-05-25: 1000 mL via INTRAVENOUS

## 2017-05-25 MED ORDER — PHENYLEPHRINE 40 MCG/ML (10ML) SYRINGE FOR IV PUSH (FOR BLOOD PRESSURE SUPPORT)
PREFILLED_SYRINGE | INTRAVENOUS | Status: AC
  Filled 2017-05-25: qty 10

## 2017-05-25 MED ORDER — PHENYLEPHRINE 40 MCG/ML (10ML) SYRINGE FOR IV PUSH (FOR BLOOD PRESSURE SUPPORT)
PREFILLED_SYRINGE | INTRAVENOUS | Status: DC | PRN
  Administered 2017-05-25: 120 ug via INTRAVENOUS
  Administered 2017-05-25: 80 ug via INTRAVENOUS

## 2017-05-25 MED ORDER — MORPHINE SULFATE (PF) 2 MG/ML IV SOLN: 1.0000 mg | INTRAVENOUS | Status: DC | PRN

## 2017-05-25 MED ORDER — HYDROMORPHONE HCL-NACL 0.5-0.9 MG/ML-% IV SOSY
0.5000 mg | PREFILLED_SYRINGE | INTRAVENOUS | Status: DC | PRN
  Administered 2017-05-25: 1 mg via INTRAVENOUS
  Filled 2017-05-25: qty 2

## 2017-05-25 MED ORDER — ROCURONIUM BROMIDE 50 MG/5ML IV SOSY
PREFILLED_SYRINGE | INTRAVENOUS | Status: AC
  Filled 2017-05-25: qty 5

## 2017-05-25 MED ORDER — ONDANSETRON HCL 4 MG/2ML IJ SOLN
INTRAMUSCULAR | Status: DC | PRN
  Administered 2017-05-25: 4 mg via INTRAVENOUS

## 2017-05-25 MED ORDER — KETOROLAC TROMETHAMINE 30 MG/ML IJ SOLN
30.0000 mg | Freq: Four times a day (QID) | INTRAMUSCULAR | Status: DC
  Administered 2017-05-25 – 2017-05-26 (×2): 30 mg via INTRAVENOUS
  Filled 2017-05-25 (×2): qty 1

## 2017-05-25 MED ORDER — DOCUSATE SODIUM 100 MG PO CAPS
100.0000 mg | ORAL_CAPSULE | Freq: Two times a day (BID) | ORAL | Status: DC
  Administered 2017-05-25 (×2): 100 mg via ORAL
  Filled 2017-05-25 (×2): qty 1

## 2017-05-25 MED ORDER — CEFAZOLIN SODIUM-DEXTROSE 2-4 GM/100ML-% IV SOLN
2.0000 g | Freq: Three times a day (TID) | INTRAVENOUS | Status: AC
  Administered 2017-05-25 (×2): 2 g via INTRAVENOUS
  Filled 2017-05-25: qty 100

## 2017-05-25 MED ORDER — PROPOFOL 10 MG/ML IV BOLUS
INTRAVENOUS | Status: AC
  Filled 2017-05-25: qty 20

## 2017-05-25 MED ORDER — ZOLPIDEM TARTRATE 5 MG PO TABS
5.0000 mg | ORAL_TABLET | Freq: Every evening | ORAL | Status: DC | PRN
  Administered 2017-05-25: 5 mg via ORAL
  Filled 2017-05-25: qty 1

## 2017-05-25 MED ORDER — BUPIVACAINE-EPINEPHRINE (PF) 0.25% -1:200000 IJ SOLN
INTRAMUSCULAR | Status: AC
  Filled 2017-05-25: qty 30

## 2017-05-25 MED ORDER — ACETAMINOPHEN 650 MG RE SUPP: 650.0000 mg | Freq: Four times a day (QID) | RECTAL | Status: DC | PRN

## 2017-05-25 MED ORDER — LACTATED RINGERS IV SOLN
INTRAVENOUS | Status: DC | PRN
  Administered 2017-05-25 (×2): via INTRAVENOUS

## 2017-05-25 MED ORDER — MIDAZOLAM HCL 2 MG/2ML IJ SOLN
INTRAMUSCULAR | Status: AC
  Filled 2017-05-25: qty 2

## 2017-05-25 MED ORDER — FENTANYL CITRATE (PF) 250 MCG/5ML IJ SOLN
INTRAMUSCULAR | Status: AC
  Filled 2017-05-25: qty 5

## 2017-05-25 MED ORDER — ROCURONIUM BROMIDE 10 MG/ML (PF) SYRINGE
PREFILLED_SYRINGE | INTRAVENOUS | Status: DC | PRN
  Administered 2017-05-25: 20 mg via INTRAVENOUS
  Administered 2017-05-25: 50 mg via INTRAVENOUS

## 2017-05-25 MED ORDER — PANTOPRAZOLE SODIUM 40 MG IV SOLR
40.0000 mg | Freq: Every day | INTRAVENOUS | Status: DC
  Administered 2017-05-25: 40 mg via INTRAVENOUS
  Filled 2017-05-25: qty 40

## 2017-05-25 MED ORDER — BUPIVACAINE LIPOSOME 1.3 % IJ SUSP
20.0000 mL | Freq: Once | INTRAMUSCULAR | Status: DC
  Filled 2017-05-25: qty 20

## 2017-05-25 MED ORDER — HYDROMORPHONE HCL-NACL 0.5-0.9 MG/ML-% IV SOSY: 0.2500 mg | PREFILLED_SYRINGE | INTRAVENOUS | Status: DC | PRN

## 2017-05-25 MED ORDER — SUGAMMADEX SODIUM 200 MG/2ML IV SOLN
INTRAVENOUS | Status: AC
  Filled 2017-05-25: qty 2

## 2017-05-25 MED ORDER — ONDANSETRON 4 MG PO TBDP: 4.0000 mg | ORAL_TABLET | Freq: Four times a day (QID) | ORAL | Status: DC | PRN

## 2017-05-25 MED ORDER — OXYCODONE HCL 5 MG PO TABS: 5.0000 mg | ORAL_TABLET | ORAL | Status: DC | PRN

## 2017-05-25 MED ORDER — ONDANSETRON HCL 4 MG/2ML IJ SOLN
4.0000 mg | Freq: Once | INTRAMUSCULAR | Status: AC
  Administered 2017-05-25: 4 mg via INTRAVENOUS
  Filled 2017-05-25: qty 2

## 2017-05-25 MED ORDER — HYDRALAZINE HCL 20 MG/ML IJ SOLN: 10.0000 mg | INTRAMUSCULAR | Status: DC | PRN

## 2017-05-25 MED ORDER — ENSURE ENLIVE PO LIQD
237.0000 mL | Freq: Two times a day (BID) | ORAL | Status: DC
  Administered 2017-05-25: 237 mL via ORAL

## 2017-05-25 MED ORDER — SUGAMMADEX SODIUM 200 MG/2ML IV SOLN
INTRAVENOUS | Status: DC | PRN
Start: 2017-05-25 — End: 2017-05-25
  Administered 2017-05-25: 200 mg via INTRAVENOUS

## 2017-05-25 MED ORDER — FENTANYL CITRATE (PF) 250 MCG/5ML IJ SOLN
INTRAMUSCULAR | Status: DC | PRN
  Administered 2017-05-25 (×2): 50 ug via INTRAVENOUS
  Administered 2017-05-25: 150 ug via INTRAVENOUS

## 2017-05-25 MED ORDER — SODIUM CHLORIDE 0.9 % IJ SOLN
INTRAMUSCULAR | Status: AC
  Filled 2017-05-25: qty 10

## 2017-05-25 MED ORDER — LIDOCAINE 2% (20 MG/ML) 5 ML SYRINGE
INTRAMUSCULAR | Status: DC | PRN
  Administered 2017-05-25: 100 mg via INTRAVENOUS

## 2017-05-25 MED ORDER — MIDAZOLAM HCL 5 MG/5ML IJ SOLN
INTRAMUSCULAR | Status: DC | PRN
  Administered 2017-05-25: 2 mg via INTRAVENOUS

## 2017-05-25 MED ORDER — CEFAZOLIN SODIUM-DEXTROSE 2-4 GM/100ML-% IV SOLN
INTRAVENOUS | Status: AC
  Filled 2017-05-25: qty 100

## 2017-05-25 MED ORDER — SUCCINYLCHOLINE CHLORIDE 200 MG/10ML IV SOSY
PREFILLED_SYRINGE | INTRAVENOUS | Status: AC
  Filled 2017-05-25: qty 10

## 2017-05-25 MED ORDER — PROPOFOL 10 MG/ML IV BOLUS
INTRAVENOUS | Status: DC | PRN
  Administered 2017-05-25: 160 mg via INTRAVENOUS

## 2017-05-25 MED ORDER — INFLUENZA VAC SPLIT QUAD 0.5 ML IM SUSY: 0.5000 mL | PREFILLED_SYRINGE | INTRAMUSCULAR | Status: DC

## 2017-05-25 MED ORDER — ENOXAPARIN SODIUM 40 MG/0.4ML ~~LOC~~ SOLN
40.0000 mg | SUBCUTANEOUS | Status: DC
  Filled 2017-05-25: qty 0.4

## 2017-05-25 MED ORDER — SIMETHICONE 80 MG PO CHEW: 40.0000 mg | CHEWABLE_TABLET | Freq: Four times a day (QID) | ORAL | Status: DC | PRN

## 2017-05-25 MED ORDER — ONDANSETRON HCL 4 MG/2ML IJ SOLN
INTRAMUSCULAR | Status: AC
  Filled 2017-05-25: qty 2

## 2017-05-25 MED ORDER — HYDROMORPHONE HCL 1 MG/ML IJ SOLN
1.0000 mg | Freq: Once | INTRAMUSCULAR | Status: AC
  Administered 2017-05-25: 1 mg via INTRAVENOUS
  Filled 2017-05-25: qty 1

## 2017-05-25 MED ORDER — HYDROMORPHONE HCL 1 MG/ML IJ SOLN
1.0000 mg | INTRAMUSCULAR | Status: DC | PRN
  Administered 2017-05-25: 1 mg via INTRAVENOUS
  Filled 2017-05-25: qty 1

## 2017-05-25 MED ORDER — KETOROLAC TROMETHAMINE 30 MG/ML IJ SOLN: 30.0000 mg | Freq: Four times a day (QID) | INTRAMUSCULAR | Status: DC | PRN

## 2017-05-25 MED ORDER — SODIUM CHLORIDE 0.9 % IV SOLN: INTRAVENOUS | Status: DC

## 2017-05-25 MED ORDER — ACETAMINOPHEN 325 MG PO TABS: 650.0000 mg | ORAL_TABLET | Freq: Four times a day (QID) | ORAL | Status: DC | PRN

## 2017-05-25 MED ORDER — DIPHENHYDRAMINE HCL 50 MG/ML IJ SOLN: 12.5000 mg | Freq: Four times a day (QID) | INTRAMUSCULAR | Status: DC | PRN

## 2017-05-25 MED ORDER — METHOCARBAMOL 500 MG PO TABS: 500.0000 mg | ORAL_TABLET | Freq: Four times a day (QID) | ORAL | Status: DC | PRN

## 2017-05-25 SURGICAL SUPPLY — 32 items
BENZOIN TINCTURE PRP APPL 2/3 (GAUZE/BANDAGES/DRESSINGS) ×3 IMPLANT
CABLE HIGH FREQUENCY MONO STRZ (ELECTRODE) ×3 IMPLANT
COVER SURGICAL LIGHT HANDLE (MISCELLANEOUS) ×3 IMPLANT
DECANTER SPIKE VIAL GLASS SM (MISCELLANEOUS) ×3 IMPLANT
DERMABOND ADVANCED (GAUZE/BANDAGES/DRESSINGS) ×1
DERMABOND ADVANCED .7 DNX12 (GAUZE/BANDAGES/DRESSINGS) ×2 IMPLANT
DEVICE SECURE STRAP 25 ABSORB (INSTRUMENTS) IMPLANT
DISSECT BALLN SPACEMKR + OVL (BALLOONS)
DISSECTOR BALLN SPACEMKR + OVL (BALLOONS) IMPLANT
DISSECTOR BLUNT TIP ENDO 5MM (MISCELLANEOUS) IMPLANT
ELECT PENCIL ROCKER SW 15FT (MISCELLANEOUS) ×3 IMPLANT
ELECT REM PT RETURN 15FT ADLT (MISCELLANEOUS) ×3 IMPLANT
GLOVE BIOGEL M 8.0 STRL (GLOVE) ×3 IMPLANT
GOWN STRL REUS W/TWL XL LVL3 (GOWN DISPOSABLE) ×9 IMPLANT
KIT BASIN OR (CUSTOM PROCEDURE TRAY) ×3 IMPLANT
MESH HERNIA 3X6 (Mesh General) ×3 IMPLANT
PAD POSITIONING PINK XL (MISCELLANEOUS) IMPLANT
SCISSORS LAP 5X35 DISP (ENDOMECHANICALS) IMPLANT
SET IRRIG TUBING LAPAROSCOPIC (IRRIGATION / IRRIGATOR) IMPLANT
SUT MNCRL AB 4-0 PS2 18 (SUTURE) ×3 IMPLANT
SUT PROLENE 2 0 CT2 30 (SUTURE) ×9 IMPLANT
SUT VIC AB 2-0 SH 27 (SUTURE) ×4
SUT VIC AB 2-0 SH 27X BRD (SUTURE) ×8 IMPLANT
SUT VIC AB 3-0 SH 27 (SUTURE)
SUT VIC AB 3-0 SH 27XBRD (SUTURE) IMPLANT
SUT VIC AB 4-0 SH 18 (SUTURE) ×6 IMPLANT
SUT VICRYL 4-0 (SUTURE) ×3 IMPLANT
SYR 30ML LL (SYRINGE) ×3 IMPLANT
TACKER 5MM HERNIA 3.5CML NAB (ENDOMECHANICALS) IMPLANT
TRAY FOLEY W/METER SILVER 16FR (SET/KITS/TRAYS/PACK) IMPLANT
TRAY LAPAROSCOPIC (CUSTOM PROCEDURE TRAY) IMPLANT
TUBING INSUF HEATED (TUBING) IMPLANT

## 2017-05-25 NOTE — ED Provider Notes (Signed)
[facility]-EMERGENCY DEPT Provider Note   CSN: [id number] Arrival date & time: 05/25/17  0011     History   Chief Complaint Chief Complaint  Patient presents with  . Abdominal Pain    HPI Andrew Vang is a 31 y.o. male.  Patient presents with severe, left inguinal pain at the site of a hernia he has known about for 6 months. Pain started yesterday afternoon and became severe with progressive swelling through the day. No fever. He developed nausea and vomiting after arrival to the hospital. He has had a right side incarcerated inguinal hernia repair performed in 2016.    The history is provided by the patient. No language interpreter was used.  Abdominal Pain   Associated symptoms include nausea and vomiting. Pertinent negatives include fever.    Past Medical History:  Diagnosis Date  . Anxiety   . Depression   . Hypertension   . Inguinal hernia recurrent bilateral     Patient Active Problem List   Diagnosis Date Noted  . Incarcerated inguinal hernia 07/12/2015    Past Surgical History:  Procedure Laterality Date  . 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 INGUINAL HERNIA REPAIR Right 07/12/2015   Procedure: HERNIA REPAIR INGUINAL ADULT;  Surgeon: Emelia Loron, MD;  Location: [facility] ORS;  Service: General;  Laterality: Right;  with MESH  . LAPAROTOMY Right 07/12/2015   Procedure: EXPLORATORY LAPAROTOMY;  Surgeon: Emelia Loron, MD;  Location: [facility] ORS;  Service: General;  Laterality: Right;       Home Medications    Prior to Admission medications   Medication Sig Start Date End Date Taking? Authorizing Provider  acetaminophen (TYLENOL) 325 MG tablet Take 2 tablets (650 mg total) by mouth every 6 (six) hours as needed for mild pain (or temp > 100). Patient not taking: Reported on 05/25/2017 07/14/15   Sherrie George, PA-C  ibuprofen (ADVIL,MOTRIN) 200 MG tablet You can take 2-3 tablets every 6 hours as needed for pain. Patient not taking: Reported on 05/25/2017  07/14/15   Sherrie George, PA-C  oxyCODONE (OXY IR/ROXICODONE) 5 MG immediate release tablet Take 1-2 tablets (5-10 mg total) by mouth every 4 (four) hours as needed for moderate pain. Patient not taking: Reported on 05/25/2017 07/14/15   Sherrie George, PA-C    Family History History reviewed. No pertinent family history.  Social History Social History  Substance Use Topics  . Smoking status: Current Every Day Smoker  . Smokeless tobacco: Never Used  . Alcohol use Yes     Allergies   Patient has no known allergies.   Review of Systems Review of Systems  Constitutional: Negative for chills and fever.  Respiratory: Negative.   Cardiovascular: Negative.   Gastrointestinal: Positive for abdominal pain, nausea and vomiting.  Genitourinary: Positive for scrotal swelling.       See HPI.  Musculoskeletal: Negative.   Skin: Negative.   Neurological: Negative.      Physical Exam Updated Vital Signs BP (!) 94/58   Pulse 77   Temp 97.8 F (36.6 C) (Oral)   Resp 12   SpO2 100%   Physical Exam  Constitutional: He is oriented to person, place, and time. He appears well-developed and well-nourished.  HENT:  Head: Normocephalic.  Neck: Normal range of motion. Neck supple.  Cardiovascular: Normal rate and regular rhythm.   No murmur heard. Pulmonary/Chest: Effort normal and breath sounds normal. He has no wheezes. He has no rales.  Abdominal: Soft. Bowel sounds are normal. There is  no tenderness. There is no rebound and no guarding.  LLQ abdomen tender with guarding.   Genitourinary:  Genitourinary Comments: Large left inguinal hernia causing marked left groin and scrotal swelling that is exquisitely tender.   Musculoskeletal: Normal range of motion.  Neurological: He is alert and oriented to person, place, and time.  Skin: Skin is warm and dry. No rash noted.  Psychiatric: He has a normal mood and affect.     ED Treatments / Results  Labs (all labs ordered are  listed, but only abnormal results are displayed) Labs Reviewed  COMPREHENSIVE METABOLIC PANEL - Abnormal; Notable for the following:       Result Value   Glucose, Bld 145 (*)    All other components within normal limits  CBC - Abnormal; Notable for the following:    WBC 13.6 (*)    MCV 73.3 (*)    MCH 25.8 (*)    All other components within normal limits  LIPASE, BLOOD  URINALYSIS, ROUTINE W REFLEX MICROSCOPIC    EKG  EKG Interpretation None       Radiology No results found.  Procedures Procedures (including critical care time)  Medications Ordered in ED Medications  ondansetron (ZOFRAN-ODT) disintegrating tablet 4 mg (4 mg Oral Given 05/25/17 0022)  HYDROmorphone (DILAUDID) injection 1 mg (1 mg Intravenous Given 05/25/17 0122)  ondansetron (ZOFRAN) injection 4 mg (4 mg Intravenous Given 05/25/17 0122)  ondansetron (ZOFRAN) injection 4 mg (4 mg Intravenous Given 05/25/17 0233)     Initial Impression / Assessment and Plan / ED Course  I have reviewed the triage vital signs and the nursing notes.  Pertinent labs & imaging results that were available during my care of the patient were reviewed by me and considered in my medical decision making (see chart for details).     Patient presents with significant left inguinal hernia, unable to be reduced, c/w incarceration.   Discussed with Dr. Donell BeersByerly who will see the patient for surgical repair of hernia.   Re-evaluation: the patient continues to have pain and nausea. BP 94/58. Bolus NS ordered.   Final Clinical Impressions(s) / ED Diagnoses   Final diagnoses:  None   1. Incarcerated left inguinal hernia  New Prescriptions New Prescriptions   No medications on file     Elpidio AnisUpstill, Georgiana Spillane, Cordelia Poche-C 05/25/17 0302    Ward, Layla MawKristen N, DO 05/25/17 [phone]

## 2017-05-25 NOTE — ED Notes (Signed)
Consent obtained and CHG performed. Selena BattenKim, RN notified.

## 2017-05-25 NOTE — Op Note (Signed)
Surgeon: Wenda LowMatt Wilbert Hayashi, MD, FACS  Asst:  none  Anes:  general  Preop Dx: Incarcerated left inguinal hernia Postop Dx: Same; containing omentum  Procedure: Open left inguinal hernia repair with mesh after reduction of incarceration Location Surgery: [facility] 4 Complications: none  EBL:   3 cc  Drains: none  Description of Procedure:  The patient was taken to OR 4 .  After anesthesia was administered and the patient was prepped a timeout was performed.  An oblique incision was made and carried down to the external oblique.  This was opened along the fibers to the external ring.  I mobilized the cord structures with a penrose around it, opened the sac and pulled a handful of omentum from the scrotum; I transected the sac as I mobilized it from the cord structures.  The neck was oversewn with 2-0 vicryl.  The floor was weak and attenuated.  I repaired this and the ring with a piece of Marlex type mesh cut to fit and sewn to the floor with running 2-0 prolene;  It went around the cord and was sutured to itself with a simple 2-0 prolene and then was tucked beneath the external oblique.  The external oblique was infiltrated with Exparel and closed with 2-0 vicryl;  4-0 vicryl closed the subcut space and then running 4-0 monocryl in the subcuticular space.    The patient tolerated the procedure well and was taken to the PACU in stable condition.     Matt B. Daphine DeutscherMartin, MD, [facility] [place] [phone]

## 2017-05-25 NOTE — Progress Notes (Signed)
Serum Creatinine and HIV lab work drawn- lab tech unable to obtain CBC -will send another lab tech to draw CBC on floor

## 2017-05-25 NOTE — Anesthesia Preprocedure Evaluation (Signed)
Anesthesia Evaluation  Patient identified by MRN, date of birth, ID band Patient awake    Reviewed: Allergy & Precautions, H&P , Patient's Chart, lab work & pertinent test results, reviewed documented beta blocker date and time   Airway Mallampati: II  TM Distance: >3 FB Neck ROM: full    Dental no notable dental hx.    Pulmonary Current Smoker,    Pulmonary exam normal breath sounds clear to auscultation       Cardiovascular hypertension,  Rhythm:regular Rate:Normal     Neuro/Psych    GI/Hepatic   Endo/Other    Renal/GU      Musculoskeletal   Abdominal   Peds  Hematology   Anesthesia Other Findings   Reproductive/Obstetrics                             Anesthesia Physical Anesthesia Plan  ASA: II  Anesthesia Plan: General   Post-op Pain Management:    Induction: Intravenous  PONV Risk Score and Plan: 2 and Ondansetron and Dexamethasone  Airway Management Planned: Oral ETT  Additional Equipment:   Intra-op Plan:   Post-operative Plan: Extubation in OR  Informed Consent: I have reviewed the patients History and Physical, chart, labs and discussed the procedure including the risks, benefits and alternatives for the proposed anesthesia with the patient or authorized representative who has indicated his/her understanding and acceptance.   Dental Advisory Given  Plan Discussed with: CRNA and Surgeon  Anesthesia Plan Comments: (  )        Anesthesia Quick Evaluation

## 2017-05-25 NOTE — ED Triage Notes (Signed)
Pt BIB [facility] for L lower abdominal hernia. Hx of surgery for R sided hernia last year. He reports that he was at work today and this one on the L started hurting him. A&Ox4. Ambulatory. Denies N/V/D.

## 2017-05-25 NOTE — Interval H&P Note (Signed)
History and Physical Interval Note:  05/25/2017 8:27 AM  Andrew Vang  has presented today for surgery, with the diagnosis of Incarcerated left inguinal hernia  The various methods of treatment have been discussed with the patient and family. After consideration of risks, benefits and other options for treatment, the patient has consented to  Procedure(s): LAPAROSCOPIC INGUINAL HERNIA INCARCERATED POSSIBLE MESH, POSSIBLE DIAGNOSTIC LAPAROSCOPY (Left) as a surgical intervention .  The patient's history has been reviewed, patient examined, no change in status, stable for surgery.  I have reviewed the patient's chart and labs.  Questions were answered to the patient's satisfaction.     [name] B

## 2017-05-25 NOTE — ED Notes (Signed)
Pt's belongings locked in security. Key taped to patients chart and given to OR nurse.

## 2017-05-25 NOTE — ED Notes (Addendum)
Pt's niece, Lamar LaundrySonya called at this time to inform her that pt will be admitted however the number on file is not correct. Attempted to call pt's boss, Von at [phone] however there was no answer.

## 2017-05-25 NOTE — Transfer of Care (Signed)
Immediate Anesthesia Transfer of Care Note  Patient: Andrew Vang  Procedure(s) Performed: Procedure(s): INGUINAL HERNIA INCARCERATED (Left) INSERTION OF MESH  Patient Location: PACU  Anesthesia Type:General  Level of Consciousness:  sedated, patient cooperative and responds to stimulation  Airway & Oxygen Therapy:Patient Spontanous Breathing and Patient connected to face mask oxgen  Post-op Assessment:  Report given to PACU RN and Post -op Vital signs reviewed and stable  Post vital signs:  Reviewed and stable  Last Vitals:  Vitals:   05/25/17 0400 05/25/17 0600  BP: 120/79 107/64  Pulse: 72 83  Resp: (!) 22 19  Temp:    SpO2: 100% 99%    Complications: No apparent anesthesia complications

## 2017-05-25 NOTE — Anesthesia Postprocedure Evaluation (Signed)
Anesthesia Post Note  Patient: Zayon Inch  Procedure(s) Performed: Procedure(s) (LRB): INGUINAL HERNIA INCARCERATED (Left) INSERTION OF MESH     Patient location during evaluation: PACU Anesthesia Type: General Level of consciousness: awake and alert Pain management: pain level controlled Vital Signs Assessment: post-procedure vital signs reviewed and stable Respiratory status: spontaneous breathing, nonlabored ventilation, respiratory function stable and patient connected to nasal cannula oxygen Cardiovascular status: blood pressure returned to baseline and stable Postop Assessment: no signs of nausea or vomiting Anesthetic complications: no    Last Vitals:  Vitals:   05/25/17 1230 05/25/17 1330  BP: 115/74 112/72  Pulse: 79 77  Resp: 16 17  Temp: (!) 36.3 C 36.6 C  SpO2: 100% 100%    Last Pain:  Vitals:   05/25/17 1330  TempSrc: Oral  PainSc:                  Jiles GarterJACKSON,Idabelle Mcpeters EDWARD

## 2017-05-25 NOTE — H&P (Signed)
Andrew Vang is an 31 y.o. male.   Chief Complaint: incarcerated left inguinal hernia HPI:  Pt is a 31 yo M who presents to the ED with around 12 hours of incarcerated hernia.  He has a history of very large RIH repaired by Dr. Donne Hazel 2 years ago with colon in it.  This one is much smaller.  He noted a hernia around 4 months ago, but it has been asymptomatic and small.  Yesterday it bulged out and he had severe crampy pain.  The pain comes in waves and is significant.  He also has thrown up a few times and feels nauseated.  He denies diarrhea or constipation.  He denies fevers, but feels cold.  He felt well prior to this.    Past Medical History:  Diagnosis Date  . Anxiety   . Depression   . Hypertension   . Inguinal hernia recurrent bilateral     Past Surgical History:  Procedure Laterality Date  . 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 INGUINAL HERNIA REPAIR Right 07/12/2015   Procedure: HERNIA REPAIR INGUINAL ADULT;  Surgeon: Rolm Bookbinder, MD;  Location: [facility] ORS;  Service: General;  Laterality: Right;  with MESH  . LAPAROTOMY Right 07/12/2015   Procedure: EXPLORATORY LAPAROTOMY;  Surgeon: Rolm Bookbinder, MD;  Location: [facility] ORS;  Service: General;  Laterality: Right;    History reviewed. No pertinent family history. Social History:  reports that he has been smoking.  He has never used smokeless tobacco. He reports that he drinks alcohol. He reports that he uses drugs, including Marijuana.  Allergies: No Known Allergies  Medications: Tylenol/ibuprofen  Results for orders placed or performed during the hospital encounter of 05/25/17 (from the past 48 hour(s))  Lipase, blood     Status: None   Collection Time: 05/25/17 12:26 AM  Result Value Ref Range   Lipase 35 11 - 51 U/L  Comprehensive metabolic panel     Status: Abnormal   Collection Time: 05/25/17 12:26 AM  Result Value Ref Range   Sodium 137 135 - 145 mmol/L   Potassium 3.6 3.5 - 5.1 mmol/L   Chloride 102 101 - 111  mmol/L   CO2 28 22 - 32 mmol/L   Glucose, Bld 145 (H) 65 - 99 mg/dL   BUN 15 6 - 20 mg/dL   Creatinine, Ser 0.96 0.61 - 1.24 mg/dL   Calcium 9.2 8.9 - 10.3 mg/dL   Total Protein 7.6 6.5 - 8.1 g/dL   Albumin 4.4 3.5 - 5.0 g/dL   AST 20 15 - 41 U/L   ALT 19 17 - 63 U/L   Alkaline Phosphatase 58 38 - 126 U/L   Total Bilirubin 0.5 0.3 - 1.2 mg/dL   GFR calc non Af Amer >60 >60 mL/min   GFR calc Af Amer >60 >60 mL/min    Comment: (NOTE) The eGFR has been calculated using the CKD EPI equation. This calculation has not been validated in all clinical situations. eGFR's persistently <60 mL/min signify possible Chronic Kidney Disease.    Anion gap 7 5 - 15  CBC     Status: Abnormal   Collection Time: 05/25/17 12:26 AM  Result Value Ref Range   WBC 13.6 (H) 4.0 - 10.5 K/uL   RBC 5.51 4.22 - 5.81 MIL/uL   Hemoglobin 14.2 13.0 - 17.0 g/dL   HCT 40.4 39.0 - 52.0 %   MCV 73.3 (L) 78.0 - 100.0 fL   MCH 25.8 (L) 26.0 -  34.0 pg   MCHC 35.1 30.0 - 36.0 g/dL   RDW 13.9 11.5 - 15.5 %   Platelets 276 150 - 400 K/uL   No results found.  Review of Systems  Constitutional: Negative.   HENT: Negative.   Eyes: Negative.   Respiratory: Negative.   Cardiovascular: Negative.   Gastrointestinal: Positive for nausea and vomiting.  Genitourinary:       Left groin pain  Skin: Negative.   Neurological: Negative.   Endo/Heme/Allergies: Negative.   Psychiatric/Behavioral: Negative.   All other systems reviewed and are negative.   Blood pressure 120/79, pulse 72, temperature 97.8 F (36.6 C), temperature source Oral, resp. rate (!) 22, SpO2 100 %. Physical Exam  Constitutional: He is oriented to person, place, and time. He appears well-developed and well-nourished. He appears distressed (looks uncomfortable).  HENT:  Head: Normocephalic and atraumatic.  Right Ear: External ear normal.  Left Ear: External ear normal.  Eyes: Pupils are equal, round, and reactive to light. Conjunctivae are normal.  No scleral icterus.  Neck: Neck supple. No tracheal deviation present.  Cardiovascular: Normal rate, regular rhythm and intact distal pulses.   Respiratory: Effort normal. No respiratory distress. He exhibits no tenderness.  GI: Soft. He exhibits no distension and no mass. There is no tenderness. There is no rebound and no guarding. A hernia is present. Hernia confirmed positive in the left inguinal area.  Musculoskeletal: Normal range of motion. He exhibits no edema or deformity.  Lymphadenopathy:       Left: No inguinal adenopathy present.  Neurological: He is alert and oriented to person, place, and time. Coordination normal.  Skin: Skin is warm and dry. He is not diaphoretic.  Tattoos on arms.  Psychiatric: He has a normal mood and affect. His behavior is normal. Judgment and thought content normal.     Assessment/Plan Incarcerated LIH with obstruction.  Admit IV fluids Pain control OR for repair of incarcerated left inguinal hernia with Dr. Hassell Done.   Will possibly have mesh placed depending on condition of bowel.   May also need diagnostic laparoscopy or ex lap if bowel compromised.    Discussed surgery with patient.  Did not have additional questions at this time.    Stark Klein, MD 05/25/2017, 6:10 AM

## 2017-05-25 NOTE — ED Notes (Signed)
Called to pt's room because pt had vomited on the floor. Large amount of emesis with gastric content noted at bedside. Will place PIV at this time

## 2017-05-25 NOTE — Anesthesia Procedure Notes (Signed)
Procedure Name: Intubation Date/Time: 05/25/2017 8:37 AM Performed by: Talbot Grumbling Pre-anesthesia Checklist: Patient identified, Emergency Drugs available, Suction available and Patient being monitored Patient Re-evaluated:Patient Re-evaluated prior to induction Oxygen Delivery Method: Circle system utilized Induction Type: IV induction and Cricoid Pressure applied Ventilation: Mask ventilation without difficulty Laryngoscope Size: Mac and 3 Grade View: Grade I Tube type: Oral Tube size: 7.5 mm Number of attempts: 1 Airway Equipment and Method: Stylet Placement Confirmation: ETT inserted through vocal cords under direct vision,  positive ETCO2 and breath sounds checked- equal and bilateral Secured at: 22 cm Tube secured with: Tape Dental Injury: Teeth and Oropharynx as per pre-operative assessment

## 2017-05-26 DIAGNOSIS — K4030 Unilateral inguinal hernia, with obstruction, without gangrene, not specified as recurrent: Secondary | ICD-10-CM

## 2017-05-26 LAB — CBC
HEMATOCRIT: 38.4 % — AB (ref 39.0–52.0)
Hemoglobin: 12.8 g/dL — ABNORMAL LOW (ref 13.0–17.0)
MCH: 25 pg — AB (ref 26.0–34.0)
MCHC: 33.3 g/dL (ref 30.0–36.0)
MCV: 75.1 fL — AB (ref 78.0–100.0)
Platelets: 284 10*3/uL (ref 150–400)
RBC: 5.11 MIL/uL (ref 4.22–5.81)
RDW: 14.2 % (ref 11.5–15.5)
WBC: 18.4 10*3/uL — ABNORMAL HIGH (ref 4.0–10.5)

## 2017-05-26 LAB — BASIC METABOLIC PANEL
Anion gap: 5 (ref 5–15)
BUN: 11 mg/dL (ref 6–20)
CALCIUM: 8.8 mg/dL — AB (ref 8.9–10.3)
CO2: 29 mmol/L (ref 22–32)
Chloride: 106 mmol/L (ref 101–111)
Creatinine, Ser: 0.96 mg/dL (ref 0.61–1.24)
GFR calc Af Amer: >60 mL/min (ref >60)
GFR calc non Af Amer: >60 mL/min (ref >60)
GLUCOSE: 115 mg/dL — AB (ref 65–99)
Potassium: 3.6 mmol/L (ref 3.5–5.1)
Sodium: 140 mmol/L (ref 135–145)

## 2017-05-26 LAB — HIV ANTIBODY (ROUTINE TESTING W REFLEX): HIV Screen 4th Generation wRfx: NONREACTIVE

## 2017-05-26 MED ORDER — OXYCODONE HCL 5 MG PO TABS: 5.0000 mg | ORAL_TABLET | ORAL | 0 refills | Status: AC | PRN

## 2017-05-26 MED ORDER — OXYCODONE HCL 5 MG PO TABS: 5.0000 mg | ORAL_TABLET | ORAL | 0 refills | Status: DC | PRN

## 2017-05-26 NOTE — Discharge Summary (Signed)
Physician Discharge Summary  Patient ID: Dianna LimboSai Pinsky MRN: [id number] DOB/AGE: 33/01/1986 33 y.o.  Admit date: 05/25/2017 Discharge date: 05/26/2017  Admission Diagnoses:  Incarcerated left inguinal hernia  Discharge Diagnoses:  Same post open reduction and repair  Principal Problem:   Incarcerated inguinal hernia, unilateral left   Surgery:  Open left inguinal hernia repair with mesh  Discharged Condition: improved  Hospital Course:   Admitted and taken to the OR where incarcerated LIH was reduced (omentum) and a high ligation of the sac and floor repair performed. He did well and had little pain and was ready for discharge on PD 1  Consults:  none Significant Diagnostic Studies: none    Discharge Exam: Blood pressure (!) 99/56, pulse 84, temperature (!) 97.5 F (36.4 C), temperature source Oral, resp. rate 18, height 5\' 9"  (1.753 m), weight 76.7 kg (169 lb), SpO2 100 %. Incisions ok  Disposition: 01-Home or Self Care  Discharge Instructions    Call MD for:  persistant nausea and vomiting    Complete by:  As directed    Call MD for:  severe uncontrolled pain    Complete by:  As directed    Call MD for:  temperature >100.4    Complete by:  As directed    Diet - low sodium heart healthy    Complete by:  As directed    Increase activity slowly    Complete by:  As directed      Allergies as of 05/26/2017   No Known Allergies     Medication List    TAKE these medications   acetaminophen 325 MG tablet Commonly known as:  TYLENOL Take 2 tablets (650 mg total) by mouth every 6 (six) hours as needed for mild pain (or temp > 100).   ibuprofen 200 MG tablet Commonly known as:  ADVIL,MOTRIN You can take 2-3 tablets every 6 hours as needed for pain.   oxyCODONE 5 MG immediate release tablet Commonly known as:  Oxy IR/ROXICODONE Take 1 tablet (5 mg total) by mouth every 4 (four) hours as needed for moderate pain. What changed:  how much to take            Discharge  Care Instructions        Start     Ordered   05/26/17 0000  oxyCODONE (OXY IR/ROXICODONE) 5 MG immediate release tablet  Every 4 hours PRN     05/26/17 0837   05/26/17 0000  Diet - low sodium heart healthy     05/26/17 0837   05/26/17 0000  Increase activity slowly     05/26/17 0837   05/26/17 0000  Call MD for:  temperature >100.4     05/26/17 0837   05/26/17 0000  Call MD for:  persistant nausea and vomiting     05/26/17 0837   05/26/17 0000  Call MD for:  severe uncontrolled pain     05/26/17 0837     Follow-up Information    Luretha MurphyMartin, Youssef Footman, MD Follow up in 3 week(s).   Specialty:  General Surgery Contact information: [address] [place] [address] [phone]           Signed: Valarie MerinoMARTIN,Jeselle Hiser B 05/26/2017, 8:42 AM

## 2017-05-26 NOTE — Discharge Instructions (Signed)
Open Hernia Repair, Adult  Open hernia repair is a surgical procedure to fix a hernia. A hernia occurs when an internal organ or tissue pushes out through a weak spot in the abdominal wall muscles. Hernias commonly occur in the groin and around the navel.  Most hernias tend to get worse over time. Often, surgery is done to prevent the hernia from becoming bigger, uncomfortable, or an emergency. Emergency surgery may be needed if abdominal contents get stuck in the opening (incarcerated hernia) or the blood supply gets cut off (strangulated hernia). In an open repair, an incision is made in the abdomen to perform the surgery.  Tell a health care provider about:   Any allergies you have.   All medicines you are taking, including vitamins, herbs, eye drops, creams, and over-the-counter medicines.   Any problems you or family members have had with anesthetic medicines.   Any blood or bone disorders you have.   Any surgeries you have had.   Any medical conditions you have, including any recent cold or flu symptoms.   Whether you are pregnant or may be pregnant.  What are the risks?  Generally, this is a safe procedure. However, problems may occur, including:   Long-lasting (chronic) pain.   Bleeding.   Infection.   Damage to the testicle. This can cause shrinking or swelling.   Damage to the bladder, blood vessels, intestine, or nerves near the hernia.   Trouble passing urine.   Allergic reactions to medicines.   Return of the hernia.    What happens before the procedure?  Staying hydrated  Follow instructions from your health care provider about hydration, which may include:   Up to 2 hours before the procedure - you may continue to drink clear liquids, such as water, clear fruit juice, black coffee, and plain tea.    Eating and drinking restrictions  Follow instructions from your health care provider about eating and drinking, which may include:   8 hours before the procedure - stop eating heavy meals  or foods such as meat, fried foods, or fatty foods.   6 hours before the procedure - stop eating light meals or foods, such as toast or cereal.   6 hours before the procedure - stop drinking milk or drinks that contain milk.   2 hours before the procedure - stop drinking clear liquids.    Medicines   Ask your health care provider about:  ? Changing or stopping your regular medicines. This is especially important if you are taking diabetes medicines or blood thinners.  ? Taking medicines such as aspirin and ibuprofen. These medicines can thin your blood. Do not take these medicines before your procedure if your health care provider instructs you not to.   You may be given antibiotic medicine to help prevent infection.  General instructions   You may have blood tests or imaging studies.   Ask your health care provider how your surgical site will be marked or identified.   If you smoke, do not smoke for at least 2 weeks before your procedure or for as long as told by your health care provider.   Let your health care provider know if you develop a cold or any infection before your surgery.   Plan to have someone take you home from the hospital or clinic.   If you will be going home right after the procedure, plan to have someone with you for 24 hours.  What happens during the procedure?     To reduce your risk of infection:  ? Your health care team will wash or sanitize their hands.  ? Your skin will be washed with soap.  ? Hair may be removed from the surgical area.   An IV tube will be inserted into one of your veins.   You will be given one or more of the following:  ? A medicine to help you relax (sedative).  ? A medicine to numb the area (local anesthetic).  ? A medicine to make you fall asleep (general anesthetic).   Your surgeon will make an incision over the hernia.   The tissues of the hernia will be moved back into place.   The edges of the hernia may be stitched together.   The opening in the  abdominal muscles will be closed with stitches (sutures). Or, your surgeon will place a mesh patch made of manmade (synthetic) material over the opening.   The incision will be closed.   A bandage (dressing) may be placed over the incision.  The procedure may vary among health care providers and hospitals.  What happens after the procedure?   Your blood pressure, heart rate, breathing rate, and blood oxygen level will be monitored until the medicines you were given have worn off.   You may be given medicine for pain.   Do not drive for 24 hours if you received a sedative.  This information is not intended to replace advice given to you by your health care provider. Make sure you discuss any questions you have with your health care provider.  Document Released: 02/23/2001 Document Revised: 03/19/2016 Document Reviewed: 02/11/2016  [employer]vier Interactive Patient Education  2018 [employer]

## 2017-09-13 IMAGING — CT CT ABD-PELV W/ CM
2 of 5 series · 15 of 46 positions shown, 17 images · IV contrast (omnipaque)
Comparison: CT of the abdomen and pelvis performed 01/11/2013.

CLINICAL DATA: Worsening right inguinal hernia, with acute onset of
pain. Leukocytosis. Initial encounter.

EXAM:
CT ABDOMEN AND PELVIS WITH CONTRAST
TECHNIQUE: Multidetector CT imaging of the abdomen and pelvis was performed
using the standard protocol following bolus administration of
intravenous contrast.
CONTRAST:  100mL OMNIPAQUE IOHEXOL 300 MG/ML  SOLN

[Series 2: abd/pel with · axial · 0.71mm/px · z∈[-537,-92]mm · 12 of 99 slices shown, 14 images]
[im 5/99  soft-tissue]
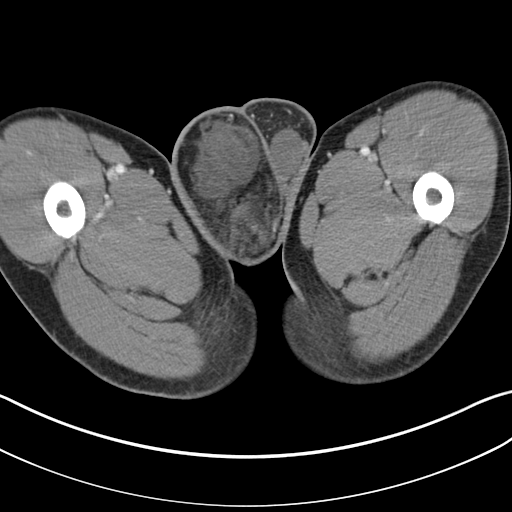
[im 5/99  bone]
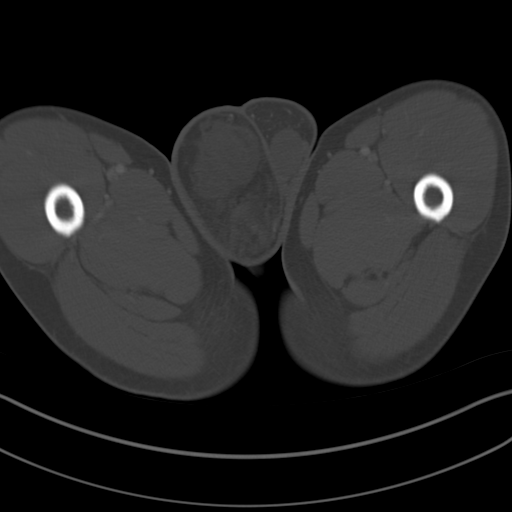
[im 13/99  soft-tissue]
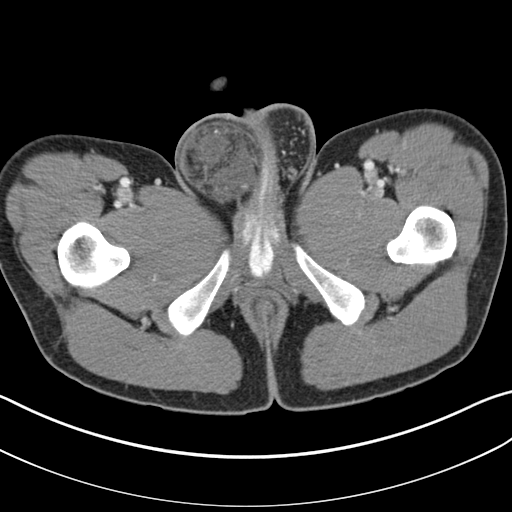
[im 22/99  soft-tissue]
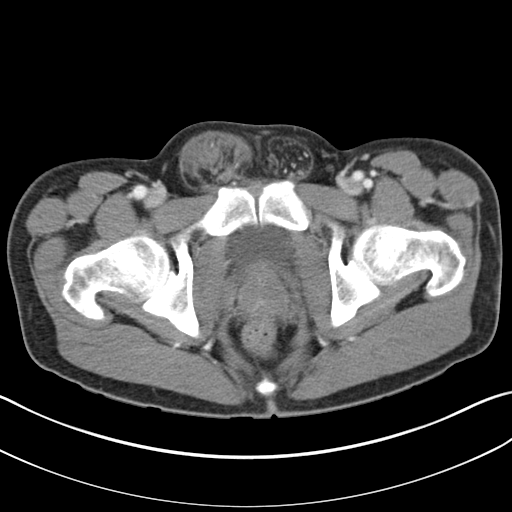
[im 30/99  soft-tissue]
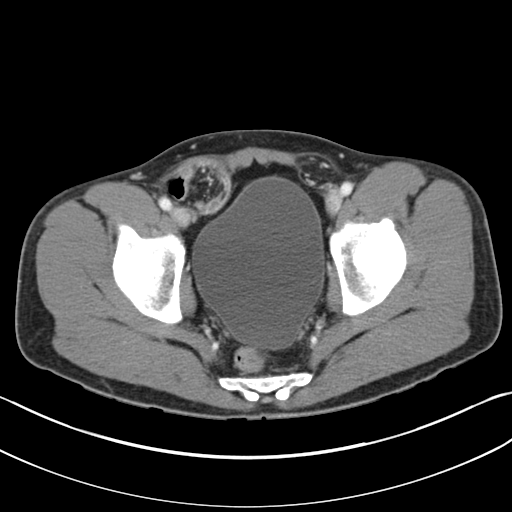
[im 39/99  soft-tissue]
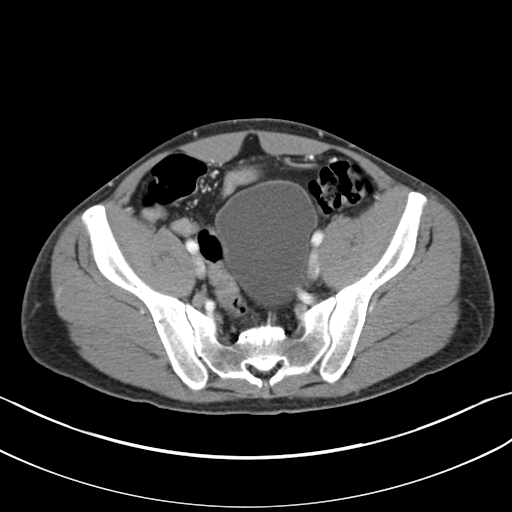
[im 47/99  soft-tissue]
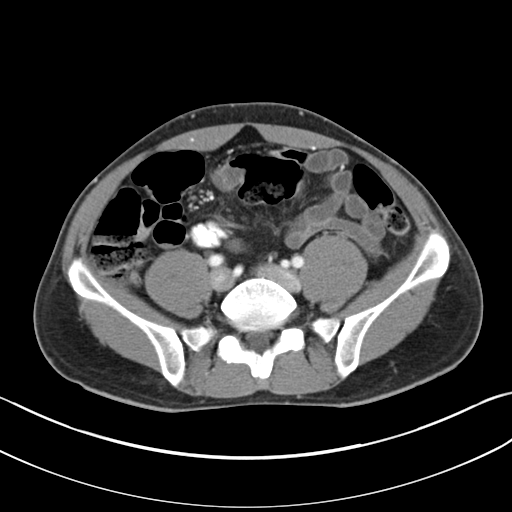
[im 52/99  soft-tissue]
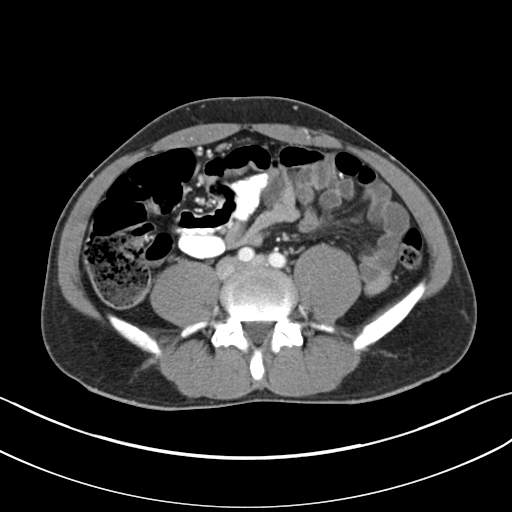
[im 60/99  soft-tissue]
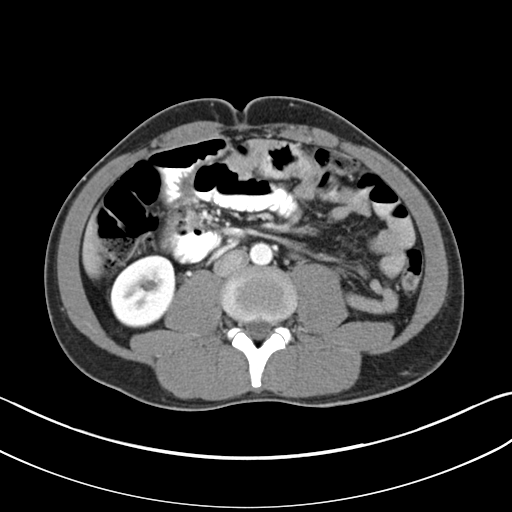
[im 69/99  soft-tissue]
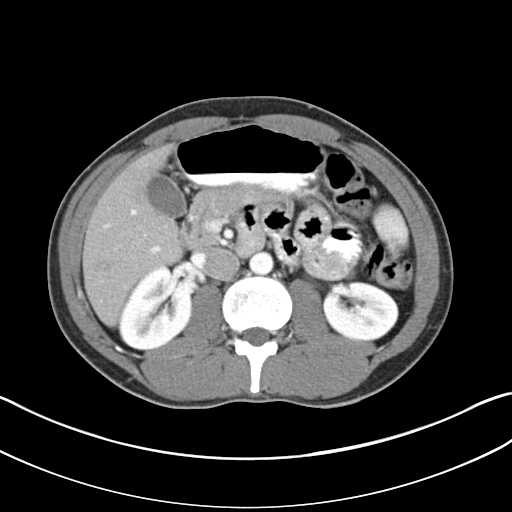
[im 69/99  bone]
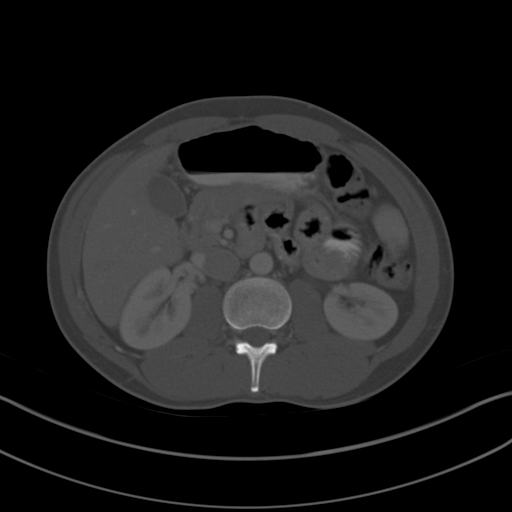
[im 77/99  soft-tissue]
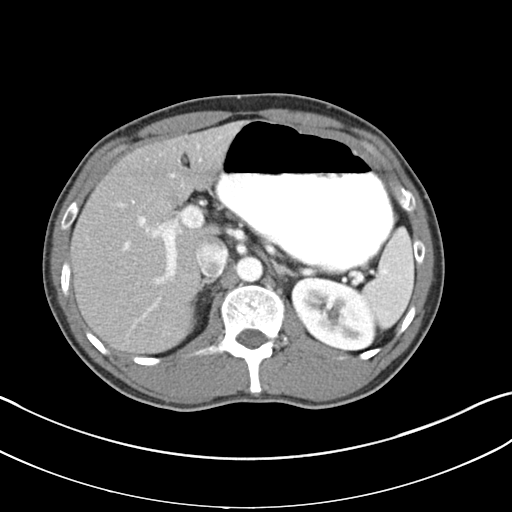
[im 86/99  soft-tissue]
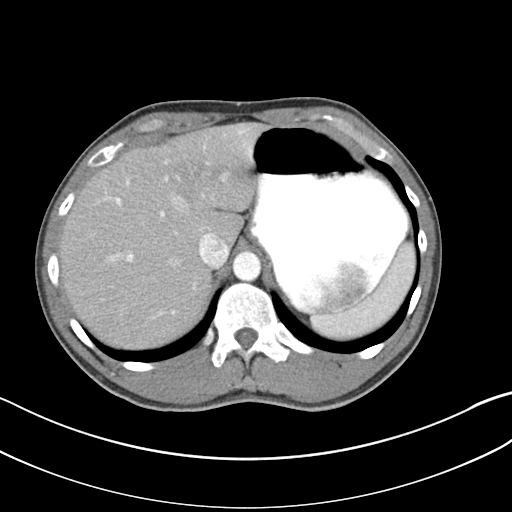
[im 94/99  soft-tissue]
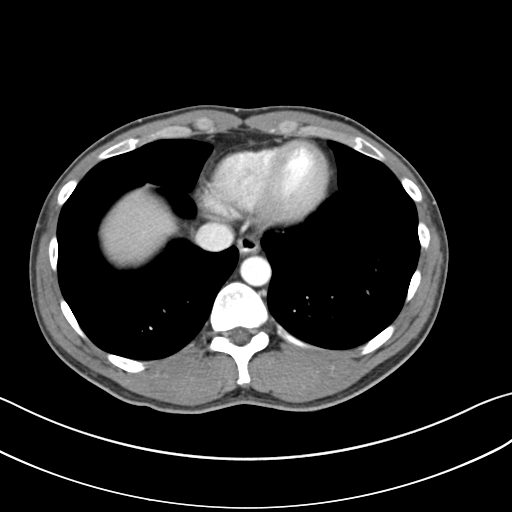

[Series 5: coronal a/|p · coronal · 0.74mm/px · 3 of 79 slices shown]
[im 27/79  soft-tissue]
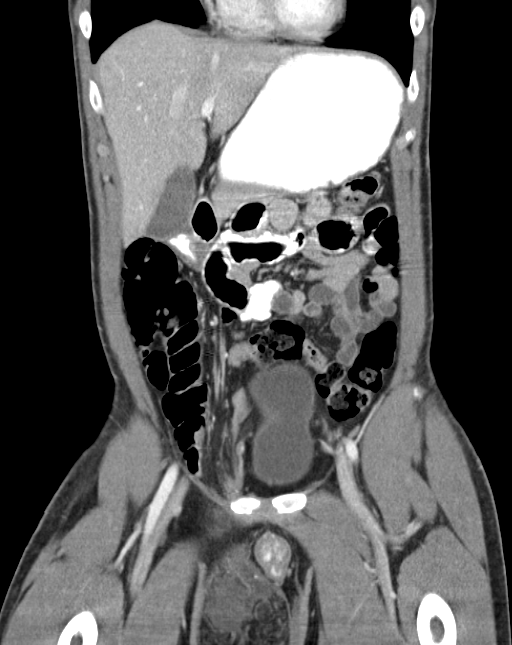
[im 35/79  soft-tissue]
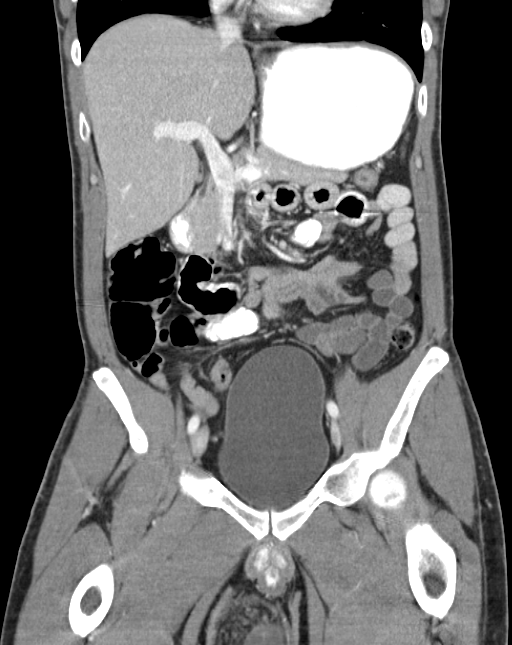
[im 44/79  soft-tissue]
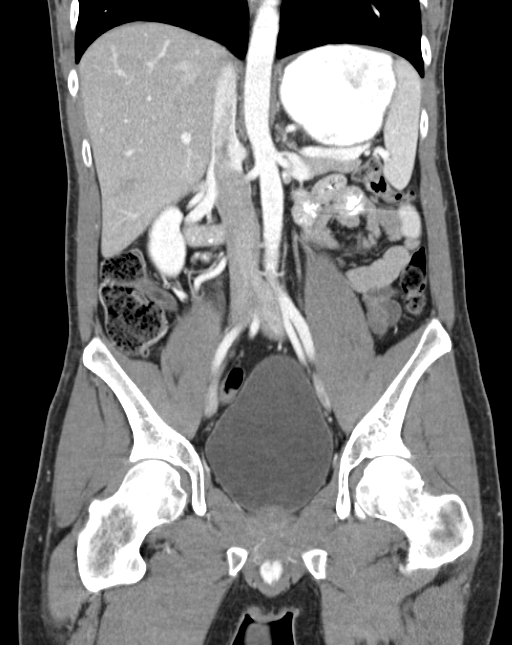

[15 of 46 positions shown; findings below may reference images not displayed]

FINDINGS: The visualized lung bases are clear.

A nonspecific 1.2 cm hypodensity is noted within the right hepatic
lobe. The liver and spleen are otherwise unremarkable. The
gallbladder is within normal limits. The pancreas and adrenal glands
are unremarkable.

The kidneys are unremarkable in appearance. There is no evidence of
hydronephrosis. No renal or ureteral stones are seen. No perinephric
stranding is appreciated.

The small bowel is unremarkable in appearance. The stomach is within
normal limits. No acute vascular abnormalities are seen.

There is herniation of the mildly redundant hepatic flexure of the
colon and proximal ascending colon into a large right inguinal
hernia, extending into the scrotum, with associated distention of
the herniated segment and surrounding soft tissue inflammation and
fluid. The more distal transverse colon is partially decompressed.
Findings are compatible with partial obstruction secondary to the
herniation.

The appendix is unremarkable in appearance, and contains air. The
remaining colon is unremarkable in appearance.

The bladder is moderately distended and grossly unremarkable. The
prostate remains normal in size. No inguinal lymphadenopathy is
seen. Mild soft tissue inflammation is noted within the scrotum,
more prominent on the right.

No acute osseous abnormalities are identified. A right-sided pars
defect is noted at L5, without evidence of anterolisthesis.
IMPRESSION: 1. Herniation of the mildly redundant hepatic flexure of the colon
and proximal ascending colon into a large right inguinal hernia,
extending into the scrotum, with associated distention of the
herniated segment and surrounding soft tissue inflammation and
fluid. Partial decompression of the more distal transverse colon.
Findings compatible with partial obstruction secondary to the
herniation.
2. Mild surrounding soft tissue inflammation noted within the
scrotum, more prominent on the right.
3. Nonspecific small 1.2 cm hypodensity within the right hepatic
lobe, new from the prior study. Would correlate with LFTs and
consider further evaluation as deemed clinically appropriate.
4. Right-sided pars defect at L5, without evidence of
anterolisthesis.
These results were called by telephone at the time of interpretation
on 07/12/2015 at [DATE] to YOUSHIN DYAKOPU PA, who verbally
acknowledged these results.
